# Patient Record
Sex: Female | Born: 2000 | Race: Black or African American | Hispanic: No | Marital: Single | Smoking: Never smoker
Health system: Southern US, Community
[De-identification: ages and names within clinical notes are randomized; demographics above are authoritative.]

## PROBLEM LIST (undated history)

## (undated) ENCOUNTER — Inpatient Hospital Stay (HOSPITAL_COMMUNITY): Payer: Self-pay

## (undated) DIAGNOSIS — O24419 Gestational diabetes mellitus in pregnancy, unspecified control: Secondary | ICD-10-CM

## (undated) DIAGNOSIS — F419 Anxiety disorder, unspecified: Secondary | ICD-10-CM

## (undated) DIAGNOSIS — F32A Depression, unspecified: Secondary | ICD-10-CM

## (undated) DIAGNOSIS — O139 Gestational [pregnancy-induced] hypertension without significant proteinuria, unspecified trimester: Secondary | ICD-10-CM

## (undated) DIAGNOSIS — F4310 Post-traumatic stress disorder, unspecified: Secondary | ICD-10-CM

## (undated) DIAGNOSIS — G40909 Epilepsy, unspecified, not intractable, without status epilepticus: Secondary | ICD-10-CM

## (undated) HISTORY — DX: Gestational (pregnancy-induced) hypertension without significant proteinuria, unspecified trimester: O13.9

## (undated) HISTORY — DX: Gestational diabetes mellitus in pregnancy, unspecified control: O24.419

---
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
## 2022-06-23 ENCOUNTER — Ambulatory Visit (INDEPENDENT_AMBULATORY_CARE_PROVIDER_SITE_OTHER): Payer: Medicaid Other | Admitting: Primary Care

## 2022-06-23 ENCOUNTER — Encounter (INDEPENDENT_AMBULATORY_CARE_PROVIDER_SITE_OTHER): Payer: Self-pay | Admitting: Primary Care

## 2022-06-23 VITALS — BP 122/92 | HR 80 | Resp 16 | Ht 63.0 in | Wt 337.2 lb

## 2022-06-23 DIAGNOSIS — L309 Dermatitis, unspecified: Secondary | ICD-10-CM

## 2022-06-23 DIAGNOSIS — F418 Other specified anxiety disorders: Secondary | ICD-10-CM

## 2022-06-23 DIAGNOSIS — F419 Anxiety disorder, unspecified: Secondary | ICD-10-CM

## 2022-06-23 DIAGNOSIS — Z7689 Persons encountering health services in other specified circumstances: Secondary | ICD-10-CM

## 2022-06-23 DIAGNOSIS — H539 Unspecified visual disturbance: Secondary | ICD-10-CM | POA: Diagnosis not present

## 2022-06-23 DIAGNOSIS — J3089 Other allergic rhinitis: Secondary | ICD-10-CM | POA: Diagnosis not present

## 2022-06-23 DIAGNOSIS — N921 Excessive and frequent menstruation with irregular cycle: Secondary | ICD-10-CM

## 2022-06-23 DIAGNOSIS — Z6841 Body Mass Index (BMI) 40.0 and over, adult: Secondary | ICD-10-CM

## 2022-06-23 DIAGNOSIS — F32A Depression, unspecified: Secondary | ICD-10-CM

## 2022-06-23 MED ORDER — CETIRIZINE HCL 10 MG PO TABS: 10.0000 mg | ORAL_TABLET | Freq: Every day | ORAL | 2 refills | Status: DC

## 2022-06-23 MED ORDER — HYDROXYZINE PAMOATE 50 MG PO CAPS: 50.0000 mg | ORAL_CAPSULE | Freq: Three times a day (TID) | ORAL | 1 refills | Status: DC | PRN

## 2022-06-23 NOTE — Progress Notes (Signed)
   New Patient Office Visit  Subjective    Patient ID: Angelica Holmes, female    DOB: [birth year]  Age: 22 y.o. MRN: [id number]  CC:  Chief Complaint  Patient presents with   New Patient (Initial Visit)    HPI Angelica Holmes presents to establish care.    Outpatient Encounter Medications as of 06/23/2022  Medication Sig   albuterol (VENTOLIN HFA) 108 (90 Base) MCG/ACT inhaler Inhale 2 puffs into the lungs every 6 (six) hours as needed for wheezing or shortness of breath.   HYDROXYZINE PAMOATE PO Take 1 tablet by mouth daily.   No facility-administered encounter medications on file as of 06/23/2022.    Past Medical History:  Diagnosis Date   Anxiety    Depression    PTSD (post-traumatic stress disorder)     No past surgical history on file.  No family history on file.  Social History   Socioeconomic History   Marital status: Single    Spouse name: Not on file   Number of children: Not on file   Years of education: Not on file   Highest education level: Not on file  Occupational History   Not on file  Tobacco Use   Smoking status: Never   Smokeless tobacco: Never  Substance and Sexual Activity   Alcohol use: Never   Drug use: Never   Sexual activity: Never  Other Topics Concern   Not on file  Social History Narrative   Not on file   Social Determinants of Health   Financial Resource Strain: Not on file  Food Insecurity: Not on file  Transportation Needs: Not on file  Physical Activity: Not on file  Stress: Not on file  Social Connections: Not on file  Intimate Partner Violence: Not on file    ROS Positive for anxiety and depression no suicidal thoughts or ideations no auditory or visual hallucinations     Objective    Blood Pressure (Abnormal) 122/92   Pulse 80   Respiration 16   Height 5\' 3"  (1.6 m)   Weight (Abnormal) 337 lb 3.2 oz (153 kg)   Last Menstrual Period 06/09/2022   Oxygen Saturation 96%   Body Mass Index 59.73 kg/m    Physical Exam Not done- patient had emergency and left will finish visit on the phone    Assessment & Plan:  Angelica Holmes was seen today for new patient (initial visit).  Diagnoses and all orders for this visit:  Encounter to establish care  Depression, unspecified depression type -     Ambulatory referral to Psychiatry  Anxiety -     Ambulatory referral to Psychiatry -     hydrOXYzine (VISTARIL) 50 MG capsule; Take 1 capsule (50 mg total) by mouth every 8 (eight) hours as needed.  Eczema, unspecified type -     hydrOXYzine (VISTARIL) 50 MG capsule; Take 1 capsule (50 mg total) by mouth every 8 (eight) hours as needed.  Morbid (severe) obesity due to excess calories (HCC)  Non-seasonal allergic rhinitis, unspecified trigger -     cetirizine (ZYRTEC) 10 MG tablet; Take 1 tablet (10 mg total) by mouth daily.  Vision changes -     Ambulatory referral to Ophthalmology  Menorrhagia with irregular cycle -     Ambulatory referral to Obstetrics / Gynecology       Kerin Perna, NP

## 2022-07-10 ENCOUNTER — Emergency Department (HOSPITAL_COMMUNITY)
Admission: EM | Admit: 2022-07-10 | Discharge: 2022-07-11 | Disposition: A | Discharge status: Home / Self Care | Payer: Medicaid Other | Attending: Emergency Medicine | Admitting: Emergency Medicine

## 2022-07-10 ENCOUNTER — Emergency Department (HOSPITAL_COMMUNITY): Payer: Medicaid Other

## 2022-07-10 ENCOUNTER — Other Ambulatory Visit: Payer: Self-pay

## 2022-07-10 DIAGNOSIS — F445 Conversion disorder with seizures or convulsions: Secondary | ICD-10-CM

## 2022-07-10 DIAGNOSIS — R569 Unspecified convulsions: Secondary | ICD-10-CM | POA: Insufficient documentation

## 2022-07-10 LAB — CBC WITH DIFFERENTIAL/PLATELET
Abs Immature Granulocytes: 0.01 K/uL (ref 0.00–0.07)
Basophils Absolute: 0 K/uL (ref 0.0–0.1)
Basophils Relative: 1 %
Eosinophils Absolute: 0.1 K/uL (ref 0.0–0.5)
Eosinophils Relative: 1 %
HCT: 37.1 % (ref 36.0–46.0)
Hemoglobin: 11.9 g/dL — ABNORMAL LOW (ref 12.0–15.0)
Immature Granulocytes: 0 %
Lymphocytes Relative: 47 %
Lymphs Abs: 2.9 K/uL (ref 0.7–4.0)
MCH: 28.7 pg (ref 26.0–34.0)
MCHC: 32.1 g/dL (ref 30.0–36.0)
MCV: 89.4 fL (ref 80.0–100.0)
Monocytes Absolute: 0.4 K/uL (ref 0.1–1.0)
Monocytes Relative: 6 %
Neutro Abs: 2.8 K/uL (ref 1.7–7.7)
Neutrophils Relative %: 45 %
Platelets: 295 K/uL (ref 150–400)
RBC: 4.15 MIL/uL (ref 3.87–5.11)
RDW: 13.9 % (ref 11.5–15.5)
WBC: 6.2 K/uL (ref 4.0–10.5)
nRBC: 0 % (ref 0.0–0.2)

## 2022-07-10 LAB — URINALYSIS, ROUTINE W REFLEX MICROSCOPIC
Bilirubin Urine: NEGATIVE
Glucose, UA: NEGATIVE mg/dL
Hgb urine dipstick: NEGATIVE
Ketones, ur: NEGATIVE mg/dL
Leukocytes,Ua: NEGATIVE
Nitrite: NEGATIVE
Protein, ur: NEGATIVE mg/dL
Specific Gravity, Urine: 1.02 (ref 1.005–1.030)
pH: 8 (ref 5.0–8.0)

## 2022-07-10 LAB — COMPREHENSIVE METABOLIC PANEL WITH GFR
ALT: 14 U/L (ref 0–44)
AST: 18 U/L (ref 15–41)
Albumin: 3.2 g/dL — ABNORMAL LOW (ref 3.5–5.0)
Alkaline Phosphatase: 82 U/L (ref 38–126)
Anion gap: 9 (ref 5–15)
BUN: 6 mg/dL (ref 6–20)
CO2: 25 mmol/L (ref 22–32)
Calcium: 8.6 mg/dL — ABNORMAL LOW (ref 8.9–10.3)
Chloride: 104 mmol/L (ref 98–111)
Creatinine, Ser: 0.86 mg/dL (ref 0.44–1.00)
GFR, Estimated: >60 mL/min
Glucose, Bld: 79 mg/dL (ref 70–99)
Potassium: 3.7 mmol/L (ref 3.5–5.1)
Sodium: 138 mmol/L (ref 135–145)
Total Bilirubin: 0.3 mg/dL (ref 0.3–1.2)
Total Protein: 6.9 g/dL (ref 6.5–8.1)

## 2022-07-10 LAB — RAPID URINE DRUG SCREEN, HOSP PERFORMED
Amphetamines: NOT DETECTED
Barbiturates: NOT DETECTED
Benzodiazepines: NOT DETECTED
Cocaine: NOT DETECTED
Opiates: NOT DETECTED
Tetrahydrocannabinol: NOT DETECTED

## 2022-07-10 LAB — I-STAT BETA HCG BLOOD, ED (MC, WL, AP ONLY): I-stat hCG, quantitative: <5 m[IU]/mL (ref <5)

## 2022-07-10 MED ORDER — LORAZEPAM 2 MG/ML IJ SOLN
INTRAMUSCULAR | Status: AC
  Filled 2022-07-10: qty 1

## 2022-07-10 NOTE — Progress Notes (Signed)
Spoke with RN. MRI reached out to her first. Tech will place Overnight EEG when patient is available.

## 2022-07-10 NOTE — ED Notes (Signed)
RN educated patient on need for follow up with referral at time of discharge and the importance of follow up of seizure diagnosis and

## 2022-07-10 NOTE — ED Triage Notes (Signed)
Patient reports multiple seizures since last night at work , denies injury , respirations unlabored . Alert and oriented .

## 2022-07-10 NOTE — ED Provider Notes (Signed)
[facility] Provider Note   CSN: [id number] Arrival date & time: 07/10/22  1916     History  Chief Complaint  Patient presents with   Seizures    Angelica Holmes is a 22 y.o. female.   Seizures  Patient is a 22 year old female with past medical history significant for depression, PTSD, anxiety, self-injurious behavior on no medications  Patient is a 22 year old female who presents emergency room today with complaints of seizures over the past 1 year.  She states that she has rarely gone more than 1 week without a seizure.  She states she does have bladder incontinence with her episodes but no tongue biting.  She denies any fevers lightheadedness or dizziness no back pain or headache.  She has a history of self injury with attempted overdose with Wellbutrin over a year ago and lacerations self-inflicted to left upper extremity however no self-harm today.      Home Medications Prior to Admission medications   Medication Sig Start Date End Date Taking? Authorizing Provider  albuterol (VENTOLIN HFA) 108 (90 Base) MCG/ACT inhaler Inhale 2 puffs into the lungs every 6 (six) hours as needed for wheezing or shortness of breath.    [provider]  cetirizine (ZYRTEC) 10 MG tablet Take 1 tablet (10 mg total) by mouth daily. 06/23/22   Kerin Perna, NP  hydrOXYzine (VISTARIL) 50 MG capsule Take 1 capsule (50 mg total) by mouth every 8 (eight) hours as needed. 06/23/22   Kerin Perna, NP      Allergies    Patient has no known allergies.    Review of Systems   Review of Systems  Neurological:  Positive for seizures.    Physical Exam Updated Vital Signs BP 134/62   Pulse 79   Temp (!) 97.3 F (36.3 C)   Resp 19   LMP 06/09/2022   SpO2 100%  Physical Exam Vitals and nursing note reviewed.  Constitutional:      General: She is not in acute distress. HENT:     Head: Normocephalic and atraumatic.     Nose:  Nose normal.     Mouth/Throat:     Mouth: Mucous membranes are moist.  Eyes:     General: No scleral icterus. Cardiovascular:     Rate and Rhythm: Normal rate and regular rhythm.     Pulses: Normal pulses.     Heart sounds: Normal heart sounds.  Pulmonary:     Effort: Pulmonary effort is normal. No respiratory distress.     Breath sounds: No wheezing.  Abdominal:     Palpations: Abdomen is soft.     Tenderness: There is no abdominal tenderness.  Musculoskeletal:     Cervical back: Normal range of motion.     Right lower leg: No edema.     Left lower leg: No edema.  Skin:    General: Skin is warm and dry.     Capillary Refill: Capillary refill takes less than 2 seconds.  Neurological:     Mental Status: She is alert.     Comments: Stuttering speech, AOx3  Right upper extremity with weakness in grip flexion extension of the elbow and unable to lift right leg off of bed.  Subjectively decreased sensation in right upper extremity right lower extremity and right face.    Psychiatric:        Mood and Affect: Mood normal.        Behavior: Behavior normal.  ED Results / Procedures / Treatments   Labs (all labs ordered are listed, but only abnormal results are displayed) Labs Reviewed  CBC WITH DIFFERENTIAL/PLATELET - Abnormal; Notable for the following components:      Result Value   Hemoglobin 11.9 (*)    All other components within normal limits  COMPREHENSIVE METABOLIC PANEL - Abnormal; Notable for the following components:   Calcium 8.6 (*)    Albumin 3.2 (*)    All other components within normal limits  RAPID URINE DRUG SCREEN, HOSP PERFORMED  URINALYSIS, ROUTINE W REFLEX MICROSCOPIC  I-STAT BETA HCG BLOOD, ED ([facility], [facility], [facility] ONLY)    EKG None  Radiology No results found.  Procedures Procedures    Medications Ordered in ED Medications - No data to display  ED Course/ Medical Decision Making/ A&P Clinical Course as of 07/10/22 2315  Sat Jul 10, 2022   2130 States she had a seizure this AM -- woke up on the floor of bathroom.  On the drive in.  And another seizure in ER.    First seen 1 year ago.  [WF]  2232 Discussed with Sal of neurology who will evaluate -- MRI ordered. [WF]    Clinical Course User Index [WF] Tedd Sias, [place]                             Medical Decision Making Amount and/or Complexity of Data Reviewed Labs: ordered. Radiology: ordered.   This patient presents to the ED for concern of seizure-like activity, this involves a number of treatment options, and is a complaint that carries with it a moderate risk of complications and morbidity. A differential diagnosis was considered for the patient's symptoms which is discussed below:   The differential includes but is not limited to psychogenic nonepileptic seizures, epilepsy, induced seizure from recreational drugs/herbs, illness, other   Co morbidities: Discussed in HPI   Brief History:  Patient is a 22 year old female with past medical history significant for depression, PTSD, anxiety, self-injurious behavior on no medications  Patient is a 22 year old female who presents emergency room today with complaints of seizures over the past 1 year.  She states that she has rarely gone more than 1 week without a seizure.  She states she does have bladder incontinence with her episodes but no tongue biting.  She denies any fevers lightheadedness or dizziness no back pain or headache.  She has a history of self injury with attempted overdose with Wellbutrin over a year ago and lacerations self-inflicted to left upper extremity however no self-harm today.    EMR reviewed including pt PMHx, past surgical history and past visits to ER.   See HPI for more details   Lab Tests:   I personally reviewed all laboratory work and imaging. Metabolic panel without any acute abnormality specifically kidney function within normal limits and no significant electrolyte  abnormalities. CBC without leukocytosis or significant anemia. CBC and CMP without any acute abnormal findings.  I sedated she negative pregnancy.  Urinalysis and urine drug screen pending at time of shift change  Imaging Studies:  MRI brain pending at time of shift change    Cardiac Monitoring:  The patient was maintained on a cardiac monitor.  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  Neurology to do EEG and MRI.  Shift change handoff plan is to discharge after normal workup.  This plan can change.  Dispostion:    Final Clinical Impression(s) / ED Diagnoses Final diagnoses:  None    Rx / DC Orders ED Discharge Orders     None         Tedd Sias, [place] 07/10/22 2317    Drenda Freeze, MD 07/10/22 [phone]

## 2022-07-10 NOTE — Progress Notes (Signed)
Patient will not be ready for the next 2 hours. EEG to be placed at the appropriate time.

## 2022-07-10 NOTE — ED Provider Notes (Signed)
  Physical Exam  BP 134/62   Pulse 79   Temp (!) 97.3 F (36.3 C)   Resp 19   LMP 06/09/2022   SpO2 100%   Physical Exam  Procedures  Procedures  ED Course / MDM   Clinical Course as of 07/10/22 2312  Sat Jul 10, 2022  2130 States she had a seizure this AM -- woke up on the floor of bathroom.  On the drive in.  And another seizure in ER.    First seen 1 year ago.  [WF]  2232 Discussed with Sal of neurology who will evaluate -- MRI ordered. [WF]    Clinical Course User Index [WF] Tedd Sias, [place]   Medical Decision Making Amount and/or Complexity of Data Reviewed Labs: ordered. Radiology: ordered.   ***  Care of this patient assumed from preceding ED provider Pati Gallo, PA-C at time of shift change. Please see her associated note for further insight into the patient's ED course. In brief, patient with > 1 year of recurrent seizure like activity. On exam today has stuttering speech, RUE /RLE weakness per preceding ED provider. At time of shift change, pending EEG and MRI brain at recommendation of neurologist Dr. Cheyenne Adas. Plan for studies tonight and likely discharge home in the AM.

## 2022-07-10 NOTE — ED Notes (Signed)
RN made aware that patient was having seizure in room. RN witnessed patient having seizure with convulsions to neck and arms. Seizure last approx 1 minute. Pt somewhat dysarthric, no confusion or decreased LOC. IV started MD made aware.

## 2022-07-10 NOTE — Consult Note (Signed)
NEUROLOGY CONSULTATION NOTE   Date of service: July 10, 2022 Patient Name: Angelica Holmes MRN:  [id number] DOB:  [birth year] Reason for consult: "Seizure vs PNES" Requesting Provider: Drenda Freeze, MD _ _ _   _ __   _ __ _ _  __ __   _ __   __ _  History of Present Illness  Angelica Holmes is a 22 y.o. female with PMH significant for anxiety, PTSD, prior intentional wellbutrin overdose as a suicide attempt, depression, who presents from home with episode concerning for seizure vs PNES.  Reports RUE jerking. The first episode she has was in feb 2021. She had another seizure like episode back in sept of 2023. 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 She was discharged from the ED. She returns today with rhythmic RUE jerking, stuttering speech and eyelid fluttering when she closes her eyes. Reports that this has been going on for 2 days now. Her boyfriend and roommate have been with her and they have been watching over her. They noted that when she would be sleeping, the movments would go away before returning when she wakes up.  No hx of seizures asa kid, no family hx of seizures or epilepsy. No hx of significant head injury with LOC, except for yesterday when she was at work and fell from a seizure in the bathroom and thinks that she hit her head. No loss of bowel or bladder. Feels like her tongue is sore with no obvious tongue bite marks noted. She is not on any medications at home.    ROS   Constitutional Denies weight loss, fever and chills.   HEENT Denies changes in vision and hearing.   Respiratory Denies SOB and cough.   CV Denies palpitations and CP   GI Denies abdominal pain, nausea, vomiting and diarrhea.   GU Denies dysuria and urinary frequency.   MSK Denies myalgia and joint pain.   Skin Denies rash and pruritus.   Neurological Denies headache and syncope.   Psychiatric Denies recent changes in mood. Denies anxiety and depression.    Past History   Past Medical  History:  Diagnosis Date   Anxiety    Depression    PTSD (post-traumatic stress disorder)    No past surgical history on file. No family history on file. Social History   Socioeconomic History   Marital status: Single    Spouse name: Not on file   Number of children: Not on file   Years of education: Not on file   Highest education level: Not on file  Occupational History   Not on file  Tobacco Use   Smoking status: Never   Smokeless tobacco: Never  Substance and Sexual Activity   Alcohol use: Never   Drug use: Never   Sexual activity: Never  Other Topics Concern   Not on file  Social History Narrative   Not on file   Social Determinants of Health   Financial Resource Strain: Not on file  Food Insecurity: Not on file  Transportation Needs: Not on file  Physical Activity: Not on file  Stress: Not on file  Social Connections: Not on file   No Known Allergies  Medications  (Not in a hospital admission)    Vitals   Vitals:   07/10/22 1923 07/10/22 2215  BP: (!) 163/115 134/62  Pulse: 93 79  Resp: 20 19  Temp: (!) 97.3 F (36.3 C)   SpO2: 100% 100%  There is no height or weight on file to calculate BMI.  Physical Exam   General: Laying comfortably in bed; in no acute distress.  HENT: Normal oropharynx and mucosa. Normal external appearance of ears and nose.  Neck: Supple, no pain or tenderness  CV: No JVD. No peripheral edema.  Pulmonary: Symmetric Chest rise. Normal respiratory effort.  Abdomen: Soft to touch, non-tender.  Ext: No cyanosis, edema, or deformity  Skin: No rash. Normal palpation of skin.   Musculoskeletal: Normal digits and nails by inspection. No clubbing.   Neurologic Examination  Mental status/Cognition: Alert, oriented to self, place, month and year, good attention.  Speech/language: Fluent, comprehension intact, object naming intact, repetition intact.  Cranial nerves:   CN II Pupils equal and reactive to light, no VF  deficits    CN III,IV,VI EOM intact, no gaze preference or deviation, no nystagmus    CN V normal sensation in V1, V2, and V3 segments bilaterally    CN VII no asymmetry, no nasolabial fold flattening    CN VIII normal hearing to speech    CN IX & X normal palatal elevation, no uvular deviation    CN XI 5/5 head turn and 5/5 shoulder shrug bilaterally    CN XII midline tongue protrusion    Motor:  Muscle bulk: normal, tone normal, pronator drift none, rhythmic twitching of R hand. Decreases in amplitude when distracted but does not completely go away. Mvmt Root Nerve  Muscle Right Left Comments  SA C5/6 Ax Deltoid 5 5   EF C5/6 Mc Biceps 5 5   EE C6/7/8 Rad Triceps 5 5   WF C6/7 Med FCR     WE C7/8 PIN ECU     F Ab C8/T1 U ADM/FDI 5 5   HF L1/2/3 Fem Illopsoas 5 5   KE L2/3/4 Fem Quad 5 5   DF L4/5 D Peron Tib Ant 5 5   PF S1/2 Tibial Grc/Sol 4+ 5    Sensation:  Light touch Mildly decreased in RUE and RLE to touch.   Pin prick    Temperature    Vibration   Proprioception    Coordination/Complex Motor:  - Finger to Nose intact BL - Heel to shin unable to do 2/2 body habitus. - Rapid alternating movement are normal - Gait: deferred.  Labs   CBC:  Recent Labs  Lab 07/10/22 1927  WBC 6.2  NEUTROABS 2.8  HGB 11.9*  HCT 37.1  MCV 89.4  PLT [id number]    Basic Metabolic Panel:  Lab Results  Component Value Date   NA 138 07/10/2022   K 3.7 07/10/2022   CO2 25 07/10/2022   GLUCOSE 79 07/10/2022   BUN 6 07/10/2022   CREATININE 0.86 07/10/2022   CALCIUM 8.6 (L) 07/10/2022   GFRNONAA >60 07/10/2022   Lipid Panel: No results found for: "LDLCALC" HgbA1c: No results found for: "HGBA1C" Urine Drug Screen:     Component Value Date/Time   LABOPIA NONE DETECTED 11/08/2021 0234   COCAINSCRNUR NONE DETECTED 11/08/2021 0234   LABBENZ NONE DETECTED 11/08/2021 0234   AMPHETMU NONE DETECTED 11/08/2021 0234   THCU NONE DETECTED 11/08/2021 0234   LABBARB NONE DETECTED 11/08/2021  0234    Alcohol Level     Component Value Date/Time   ETH <10 11/08/2021 0237    CT Head without contrast from 11/08/21(Personally reviewed): CTH was negative for a large hypodensity concerning for a large territory infarct or hyperdensity concerning for an ICH  MRI Brain(Personally  reviewed): pending  cEEG:  pending  Impression   Angelica Holmes is a 22 y.o. female with PMH significant for anxiety, PTSD, prior intentional wellbutrin overdose as a suicide attempt, depression, who presents from home with episode concerning for seizure vs PNES. This has been ongoing for 2 days. The movement seems rhythmic at times and ?distractable as they improve when she is distracted but do not completely go away. She has stuttering speech intermittently along with eyelid fluttering at times.  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 However, given the fact that this has been occurring persistently, will put her up on LTM EEG to characterize these.  Recommendations  - getting MRI Brain - Will put her up on LTM EEG. - I don't particularly think that she needs to be admitted, good chance we would be able to capture on LTM overnight since these movements are persistent. ______________________________________________________________________   Thank you for the opportunity to take part in the care of this patient. If you have any further questions, please contact the neurology consultation attending.  Signed,  [place] Pager Number [phone] _ _ _   _ __   _ __ _ _  __ __   _ __   __ _

## 2022-07-11 ENCOUNTER — Emergency Department (HOSPITAL_COMMUNITY): Payer: Medicaid Other

## 2022-07-11 DIAGNOSIS — R569 Unspecified convulsions: Secondary | ICD-10-CM

## 2022-07-11 DIAGNOSIS — F449 Dissociative and conversion disorder, unspecified: Secondary | ICD-10-CM

## 2022-07-11 NOTE — ED Notes (Signed)
EEG tech notified that patient is back from MRI so that over night EEG can be applied.

## 2022-07-11 NOTE — Procedures (Signed)
Patient Name: Angelica Holmes  MRN: [id number]  Epilepsy Attending: Lora Havens  Referring Physician/Provider: Donnetta Simpers, MD   Duration: 07/11/2022 0200 to 1042  Patient history: 22 y.o. female with PMH significant for anxiety, PTSD, prior intentional wellbutrin overdose as a suicide attempt, depression, who presents from home with episode concerning for seizure vs PNES.  EEG to evaluate for seizure.   Level of alertness: Awake, asleep  AEDs during EEG study: None  Technical aspects: This EEG study was done with scalp electrodes positioned according to the 10-20 International system of electrode placement. Electrical activity was reviewed with band pass filter of 1-70Hz$ , sensitivity of 7 uV/mm, display speed of 59m/sec with a 60Hz$  notched filter applied as appropriate. EEG data were recorded continuously and digitally stored.  Video monitoring was available and reviewed as appropriate.  Description: The posterior dominant rhythm consists of 9 Hz activity of moderate voltage (25-35 uV) seen predominantly in posterior head regions, symmetric and reactive to eye opening and eye closing. Sleep was characterized by vertex waves, sleep spindles (12 to 14 Hz), maximal frontocentral region.  Hyperventilation and photic stimulation were not performed.     Event button was pressed on 07/11/2022 at 1029 for right arm tremor like movement ( video ws unavailable for review)Concomitant eeg before, during and after the event showed normal posterior dominant rhythm.   IMPRESSION: This study is within normal limits. No seizures or epileptiform discharges were seen throughout the recording.  One event was noted on 07/11/2022 at 1029. Patient was noted to have right arm tremor like movement without concomitant eeg change. This was NOT an epileptic event.   A normal interictal EEG does not exclude the diagnosis of epilepsy.  [place] OBarbra Sarks

## 2022-07-11 NOTE — Progress Notes (Signed)
LTM EEG discontinued - no skin breakdown at [facility]. GMD/NA

## 2022-07-11 NOTE — ED Provider Notes (Signed)
Care handoff received from [place] PA-C at shift change please see previous provider note for full details of visit.  In short 22 year old female presented for recurrent seizure-like activity.  Patient was seen by neurology who had recommended MRI and EEG today.  MRI was without acute findings.  Plan was for overnight EEG, after completion consult neuro anticipate discharge. Physical Exam  BP (!) 128/90   Pulse 81   Temp 98 F (36.7 C) (Oral)   Resp 20   LMP 06/09/2022   SpO2 100%   Physical Exam Constitutional:      General: She is not in acute distress.    Appearance: Normal appearance. She is well-developed. She is not ill-appearing or diaphoretic.  HENT:     Head: Normocephalic and atraumatic.  Eyes:     General: Vision grossly intact. Gaze aligned appropriately.     Pupils: Pupils are equal, round, and reactive to light.  Neck:     Trachea: Trachea and phonation normal.  Pulmonary:     Effort: Pulmonary effort is normal. No respiratory distress.  Musculoskeletal:        General: Normal range of motion.     Cervical back: Normal range of motion.  Skin:    General: Skin is warm and dry.  Neurological:     Mental Status: She is alert.     GCS: GCS eye subscore is 4. GCS verbal subscore is 5. GCS motor subscore is 6.     Comments: Speech is clear and goal oriented, follows commands Major Cranial nerves without deficit, no facial droop Moves extremities without ataxia, coordination intact  Psychiatric:        Behavior: Behavior normal.     Procedures  Procedures  ED Course / MDM   Clinical Course as of 07/11/22 1051  Sat Jul 10, 2022  2130 States she had a seizure this AM -- woke up on the floor of bathroom.  On the drive in.  And another seizure in ER.    First seen 1 year ago.  [WF]  2232 Discussed with Sal of neurology who will evaluate -- MRI ordered. [WF]  Sun Jul 11, 2022  0608 Patient remains on overnight EEG with video monitoring at this time.  [RS]  [id number] kirkpatrick [BM]    Clinical Course User Index [BM] Deliah Boston, PA-C [RS] Sponseller, Gypsy Balsam, PA-C [WF] Tedd Sias, [place]   Medical Decision Making Amount and/or Complexity of Data Reviewed Labs: ordered.    Details: CBC without leukocytosis to suggest infectious process.  Mild anemia of 11.9.  No thrombocytopenia. CMP without emergent electrolyte derangement, AKI, LFT elevations or gap. Pregnancy test negative. UDS negative. Urinalysis no evidence for infection. Radiology: ordered. ECG/medicine tests:     Details: I personally reviewed and interpreted patient's twelve-lead EKG.  I do not appreciate any obvious acute ischemic changes. Discussion of management or test interpretation with external provider(s): Dr. Leonel Ramsay.  Risk Risk Details: EEG interpreted by neurology without epileptiform discharges.  I consulted with Dr. Leonel Ramsay who asked that we keep the patient here for him to see and then anticipates discharge.   Patient seen and evaluated by neurologist Dr. 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  Advises that patient can be discharged and recommends outpatient follow-up with a psychologist.  I will refer patient to [facility].  Patient is in agreement with plan.  All questions answered.  At time of discharge patient is well-appearing and in no acute distress vital signs stable  on room air.  Precautions regarding motor vehicle and other dangerous activities given.  At this time there does not appear to be any evidence of an acute emergency medical condition and the patient appears stable for discharge with appropriate outpatient follow up. Diagnosis was discussed with patient who verbalizes understanding of care plan and is agreeable to discharge. I have discussed return precautions with patient who verbalizes understanding. Patient encouraged to follow-up with their PCP and behavioral health. All questions answered.  Patient's case  discussed with Dr. Sabra Heck who agrees with plan to discharge with follow-up.   Note: Portions of this report may have been transcribed using voice recognition software. Every effort was made to ensure accuracy; however, inadvertent computerized transcription errors may still be present.        Gari Crown 07/11/22 1052    Noemi Chapel, MD 07/19/22 [phone]

## 2022-07-11 NOTE — Progress Notes (Signed)
LTM EEG hooked up and running - no initial skin breakdown - push button tested - No [facility] at this time; patient is in the ED currently.

## 2022-07-11 NOTE — ED Notes (Signed)
Patient returned from MRI.

## 2022-07-11 NOTE — ED Notes (Signed)
Eeg tech at bedside. Friend waiting with patient was given recliner for comfort while waiting.

## 2022-07-11 NOTE — Discharge Instructions (Signed)
At this time there does not appear to be the presence of an emergent medical condition, however there is always the potential for conditions to change. Please read and follow the below instructions.  Please return to the Emergency Department immediately for any new or worsening symptoms. Please be sure to follow up with your Primary Care Provider within one week regarding your visit today; please call their office to schedule an appointment even if you are feeling better for a follow-up visit. Please do not drive, climb, swim or perform any other dangerous activities until cleared to do so by your doctor.  Please read the additional information packets attached to your discharge summary.  Go to the nearest Emergency Department immediately if: You have fever or chills You injure yourself during a seizure. You have one seizure after another. You have trouble recovering from a seizure. You have chest pain or trouble breathing. A seizure lasts longer than 5 minutes. You think about harming yourself or others. You have any new/concerning or worsening of symptoms.  Do not take your medicine if  develop an itchy rash, swelling in your mouth or lips, or difficulty breathing; call 911 and seek immediate emergency medical attention if this occurs.  You may review your lab tests and imaging results in their entirety on your MyChart account.  Please discuss all results of fully with your primary care provider and other specialist at your follow-up visit.  Note: Portions of this text may have been transcribed using voice recognition software. Every effort was made to ensure accuracy; however, inadvertent computerized transcription errors may still be present.

## 2022-07-11 NOTE — Consult Note (Signed)
Subjective: Overnight EEG is normal.   Exam: Vitals:   07/11/22 0915 07/11/22 0930  BP: (!) 143/82 (!) 133/96  Pulse: 89 68  Resp: 19 17  Temp:    SpO2: 98% 99%   Gen: In bed, NAD Resp: non-labored breathing, no acute distress Abd: soft, nt  Neuro: MS: awake, alert, has stutter but no aphasia [id number], VFF Motor: some give way on right, no definite weakness Sensory: decreased on right, splits midline to pinprick on forehead She has a coarse right arm tremor that is clearly distractible on exam.   Pertinent Labs: MRI negative EEG negative  Impression: 22 yo F with non-organic right arm shaking and stuttering speech. 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 Her right arm tremor and distractible tremor are most consistent with conversion disorder. I have advised that she follow up with an outpatient psychologist to explore possible underlying stressors that could be contributing to this.   Recommendations: 1) Can d/c EEG 2) No further neurological evaluation is needed at this time. Please call with further questions or concerns.   Roland Rack, MD [facility] [phone]  If 7pm- 7am, please page neurology on call as listed in [place].

## 2022-09-28 ENCOUNTER — Ambulatory Visit (INDEPENDENT_AMBULATORY_CARE_PROVIDER_SITE_OTHER): Payer: Medicaid Other | Admitting: Primary Care

## 2022-09-28 ENCOUNTER — Ambulatory Visit (HOSPITAL_COMMUNITY)
Admission: EM | Admit: 2022-09-28 | Discharge: 2022-09-28 | Disposition: A | Discharge status: Home / Self Care | Payer: No Typology Code available for payment source | Attending: Psychiatry | Admitting: Psychiatry

## 2022-09-28 VITALS — BP 128/83 | HR 87 | Resp 16 | Wt 341.8 lb

## 2022-09-28 DIAGNOSIS — F419 Anxiety disorder, unspecified: Secondary | ICD-10-CM

## 2022-09-28 DIAGNOSIS — F32A Depression, unspecified: Secondary | ICD-10-CM

## 2022-09-28 DIAGNOSIS — Z9151 Personal history of suicidal behavior: Secondary | ICD-10-CM | POA: Insufficient documentation

## 2022-09-28 DIAGNOSIS — R569 Unspecified convulsions: Secondary | ICD-10-CM

## 2022-09-28 NOTE — Progress Notes (Signed)
Subjective    Patient ID: Angelica Holmes, female    DOB: June 15, 2000  Age: 22 y.o. MRN: [id number]  CC:  No chief complaint on file.   HPI Ms.Angelica Holmes is a 22 year old female for follow up presents problems with anxiety, depression, self esteem. Hired and had a seizure at work non empathetic feels it is emotional per neurology. Patient has No headache, No chest pain, No abdominal pain - No Nausea, No new weakness tingling or numbness, No Cough - shortness of breath.  Outpatient Encounter Medications as of 09/28/2022  Medication Sig  . albuterol (VENTOLIN HFA) 108 (90 Base) MCG/ACT inhaler Inhale 2 puffs into the lungs every 6 (six) hours as needed for wheezing or shortness of breath.  . cetirizine (ZYRTEC) 10 MG tablet Take 1 tablet (10 mg total) by mouth daily.  . hydrOXYzine (VISTARIL) 50 MG capsule Take 1 capsule (50 mg total) by mouth every 8 (eight) hours as needed.   No facility-administered encounter medications on file as of 09/28/2022.    Past Medical History:  Diagnosis Date  . Anxiety   . Depression   . PTSD (post-traumatic stress disorder)     No past surgical history on file.  No family history on file.  Social History   Socioeconomic History  . Marital status: Single    Spouse name: Not on file  . Number of children: Not on file  . Years of education: Not on file  . Highest education level: GED or equivalent  Occupational History  . Not on file  Tobacco Use  . Smoking status: Never  . Smokeless tobacco: Never  Substance and Sexual Activity  . Alcohol use: Never  . Drug use: Never  . Sexual activity: Never  Other Topics Concern  . Not on file  Social History Narrative  . Not on file   Social Determinants of Health   Financial Resource Strain: High Risk (09/28/2022)   Overall Financial Resource Strain (CARDIA)   . Difficulty of Paying Living Expenses: Hard  Food Insecurity: Food Insecurity Present (09/28/2022)   Hunger Vital Sign   . Worried  About Programme researcher, broadcasting/film/video in the Last Year: Sometimes true   . Ran Out of Food in the Last Year: Often true  Transportation Needs: No Transportation Needs (09/28/2022)   PRAPARE - Transportation   . Lack of Transportation (Medical): No   . Lack of Transportation (Non-Medical): No  Physical Activity: Insufficiently Active (09/28/2022)   Exercise Vital Sign   . Days of Exercise per Week: 1 day   . Minutes of Exercise per Session: 20 min  Stress: Stress Concern Present (09/28/2022)   [employer] of Occupational Health - Occupational Stress Questionnaire   . Feeling of Stress : Very much  Social Connections: Socially Isolated (09/28/2022)   Social Connection and Isolation Panel [NHANES]   . Frequency of Communication with Friends and Family: Twice a week   . Frequency of Social Gatherings with Friends and Family: Never   . Attends Religious Services: Never   . Active Member of Clubs or Organizations: No   . Attends Banker Meetings: Not on file   . Marital Status: Living with partner  Intimate Partner Violence: Not on file    ROS Positive for anxiety and depression no suicidal thoughts or ideations no auditory or visual hallucinations     Objective    There were no vitals taken for this visit.  Physical Exam Not done- patient  had emergency and left will finish visit on the phone    Assessment & Plan:  Angelica Holmes was seen today for new patient (initial visit).  Diagnoses and all orders for this visit:  Encounter to establish care  Depression, unspecified depression type -     Ambulatory referral to Psychiatry  Anxiety -     Ambulatory referral to Psychiatry -     hydrOXYzine (VISTARIL) 50 MG capsule; Take 1 capsule (50 mg total) by mouth every 8 (eight) hours as needed.  Eczema, unspecified type -     hydrOXYzine (VISTARIL) 50 MG capsule; Take 1 capsule (50 mg total) by mouth every 8 (eight) hours as needed.  Morbid (severe) obesity due to excess calories  (HCC)  Non-seasonal allergic rhinitis, unspecified trigger -     cetirizine (ZYRTEC) 10 MG tablet; Take 1 tablet (10 mg total) by mouth daily.  Vision changes -     Ambulatory referral to Ophthalmology  Menorrhagia with irregular cycle -     Ambulatory referral to Obstetrics / Gynecology       Grayce Sessions, NP

## 2022-09-28 NOTE — ED Provider Notes (Signed)
Behavioral Health Urgent Care Medical Screening Exam  Patient Name: Angelica Holmes MRN: [id number] Date of Evaluation: 09/28/22 Chief Complaint: "my primary care doctor" Diagnosis:  Final diagnoses:  Anxiety disorder, unspecified type  Seizure-like activity (HCC)   History of Present illness: Angelica Holmes is a 22 y.o. female. Pt presents voluntarily to [facility] behavioral health for walk-in assessment.  Pt is accompanied by her boyfriend, Benjamine Mola, who remains throughout the assessment as per pt request/consent. Pt is assessed face-to-face by nurse practitioner.   Angelica Holmes, 22 y.o., female patient seen face to face by this provider; and chart reviewed on 09/28/22.  On evaluation Angelica Holmes reports presenting today due to "my primary care doctor". She reports she was seen earlier this date and there was miscommunication after she endorsed having passing thought the other day that she "wouldn't be so stressed if not here anymore". She denies having a plan or intent to act on her thoughts at the time. Reports she spoke with her boyfriend and felt better. She states primary stressor for her has been seizure like activity that has been ongoing for the past 1 to 2 years which has made it difficult for her to maintain employment. Reports was last employed at the end of February or March. She states she has had multiple CT and EEGs and told that she has been having non epileptic seizures.  Pt denies suicidal, homicidal or violent ideations. She denies auditory visual hallucinations or paranoia.  Pt reports history of non suicidal self injurious behavior, cutting, last occurring several years earlier. Pt reports history of 2 or 3 suicide attempts between middle school and high school, last occurring several years ago. Pt reports history of inpatient psychiatric hospitalizations, last occurring several years ago after last suicide attempt. Reports last inpatient psychiatric  hospitalization was in [place], [place].   Pt denies use of nicotine, marijuana, alcohol, opioids, other substances.   Pt reports medical history of asthma, seasonal allergies.   Pt reports psychiatric history of anxiety, mdd.  Pt reports family history of bipolar 2 disorder (mother).  Pt denies access to a firearm or other weapon.  Pt reports she is living with her boyfriend, Benjamine Mola, and her friend.  Discussed recommendation for outpatient psychiatry and counseling, which pt and pt's boyfriend agree with. Pt's boyfriend denies safety concerns with discharge today. Discussed following up with outpatient psychiatry and counseling at [facility]. Pt can be seen tomorrow for both services. Pt and pt's boyfriend agree to got to open access hours at [facility] tomorrow.  Flowsheet Row ED from 09/28/2022 in [facility] [place] ED from 07/10/2022 in [facility] Emergency Department at [facility] ED from 11/08/2021 in [facility] Emergency Department at [facility]  C-SSRS RISK CATEGORY Low Risk No Risk No Risk       Psychiatric Specialty Exam  Presentation  General Appearance:Casual  Eye Contact:Fair  Speech:Clear and Coherent  Speech Volume:Normal  Handedness:Right   Mood and Affect  Mood: Dysphoric  Affect: Congruent; Appropriate   Thought Process  Thought Processes: Coherent; Goal Directed  Descriptions of Associations:Intact  Orientation:Full (Time, Place and Person)  Thought Content:Logical    Hallucinations:None  Ideas of Reference:None  Suicidal Thoughts:No  Homicidal Thoughts:No   Sensorium  Memory: Immediate Good; Recent Good; Remote Good  Judgment: Good  Insight: Good   Executive Functions  Concentration: Good  Attention Span: Good  Recall: Good  Fund of Knowledge: Good  Language: Good   Psychomotor Activity  Psychomotor Activity: Normal   Assets  Assets: Housing; Social Support; Resilience;  Physical Health; Communication Skills; Desire for Improvement; Financial Resources/Insurance; Vocational/Educational; Transportation   Sleep  Sleep: Good  Number of hours: No data recorded  Physical Exam: Physical Exam Constitutional:      General: She is not in acute distress.    Appearance: She is not ill-appearing, toxic-appearing or diaphoretic.  Eyes:     General: No scleral icterus. Cardiovascular:     Rate and Rhythm: Normal rate.  Pulmonary:     Effort: Pulmonary effort is normal. No respiratory distress.  Neurological:     Mental Status: She is alert and oriented to person, place, and time.  Psychiatric:        Attention and Perception: Attention and perception normal.        Mood and Affect: Affect normal. Mood is anxious.        Speech: Speech normal.        Behavior: Behavior normal. Behavior is cooperative.        Thought Content: Thought content normal.        Cognition and Memory: Cognition and memory normal.        Judgment: Judgment normal.    Review of Systems  Constitutional:  Negative for chills and fever.  Respiratory:  Negative for shortness of breath.   Cardiovascular:  Negative for chest pain and palpitations.  Gastrointestinal:  Negative for abdominal pain.  Neurological:        Seizure like activity   Psychiatric/Behavioral:  The patient is nervous/anxious.    Blood pressure (!) 148/95, pulse 99, temperature 98.1 F (36.7 C), temperature source Oral, resp. rate 18, SpO2 98 %. There is no height or weight on file to calculate BMI.  Musculoskeletal: Strength & Muscle Tone: within normal limits Gait & Station: normal Patient leans: N/A   [facility] MSE Discharge Disposition for Follow up and Recommendations: Based on my evaluation the patient does not appear to have an emergency medical condition and can be discharged with resources and follow up care in outpatient services for Medication Management and Individual Therapy   Lauree Chandler,  NP 09/28/2022, 2:05 PM

## 2022-09-28 NOTE — Discharge Instructions (Addendum)
Please come to open access hours at [facility] (this facility, [address] [place], [place], [place] [address]) to establish outpatient psychiatry and counseling services.   Open access hours for counseling are Monday, Wednesday, Thursday and start at 7:30AM. Please arrive early, such as by 7AM, to increase the likelihood of being seen the same day. Walk ins are seen first come first served. You may not be seen the same day you arrive.  Open access hours for medication management are Monday through Friday and start at 7:30AM. Please arrive early, such as by 7AM, to increase the likelihood of being seen the same day. Walk ins are seen first come first served. You may not be seen the same day you arrive.

## 2022-09-28 NOTE — Progress Notes (Signed)
   09/28/22 1315  [facility] Triage Screening (Walk-ins at [facility] only)  How Did You Hear About [place]? Primary Care  What Is the Reason for Your Visit/Call Today? Pt presents to [facility] voluntarily accompanied by her boyfriend. Pt states she was referred by her PCP Gwinda Passe at [facility] due to passive SI. Pt states she told her PCP she briefly experienced SI but does not feel this way at the moment. Pt denies any plan or intent to harm herself. Pt reports hx of seizures, last seizure was this past week. She has a treatment team through [facility] that assists her with finding providers and checks in with her weekly.Pt is interested in outpatient therapy and psychiatry. Pt denies HI and AVH.  How Long Has This Been Causing You Problems? <Week  Have You Recently Had Any Thoughts About Hurting Yourself? Yes  How long ago did you have thoughts about hurting yourself? passive this week  Are You Planning to Commit Suicide/Harm Yourself At This time? No  Have you Recently Had Thoughts About Hurting Someone Karolee Ohs? No  Are You Planning To Harm Someone At This Time? No  Are you currently experiencing any auditory, visual or other hallucinations? No  Have You Used Any Alcohol or Drugs in the Past 24 Hours? No  Do you have any current medical co-morbidities that require immediate attention? No  Clinician description of patient physical appearance/behavior: calm ,cooperative  What Do You Feel Would Help You the Most Today? Treatment for Depression or other mood problem  If access to [facility] was not available, would you have sought care in the Emergency Department? No  Determination of Need Routine (7 days)  Options For Referral Outpatient Therapy;Medication Management

## 2023-01-25 ENCOUNTER — Other Ambulatory Visit (HOSPITAL_COMMUNITY): Payer: Self-pay

## 2023-01-25 MED ORDER — WEGOVY 0.25 MG/0.5ML ~~LOC~~ SOAJ
0.5000 mL | SUBCUTANEOUS | 0 refills | Status: DC
  Filled 2023-01-25: qty 2, 28d supply, fill #0

## 2023-02-10 ENCOUNTER — Other Ambulatory Visit (HOSPITAL_COMMUNITY): Payer: Self-pay

## 2023-02-10 MED ORDER — HYDROXYZINE PAMOATE 50 MG PO CAPS
50.0000 mg | ORAL_CAPSULE | Freq: Three times a day (TID) | ORAL | 0 refills | Status: DC | PRN
  Filled 2023-02-10: qty 90, 30d supply, fill #0

## 2023-02-10 MED ORDER — VRAYLAR 1.5 MG PO CAPS
1.5000 mg | ORAL_CAPSULE | Freq: Every day | ORAL | 0 refills | Status: DC
  Filled 2023-02-10: qty 30, 30d supply, fill #0

## 2023-02-16 ENCOUNTER — Other Ambulatory Visit (HOSPITAL_COMMUNITY): Payer: Self-pay

## 2023-02-16 MED ORDER — WEGOVY 0.5 MG/0.5ML ~~LOC~~ SOAJ
0.5000 mg | SUBCUTANEOUS | 0 refills | Status: DC
  Filled 2023-02-16: qty 2, 28d supply, fill #0

## 2023-02-16 MED ORDER — ALBUTEROL SULFATE HFA 108 (90 BASE) MCG/ACT IN AERS
2.0000 | INHALATION_SPRAY | Freq: Four times a day (QID) | RESPIRATORY_TRACT | 5 refills | Status: AC | PRN
  Filled 2023-02-16: qty 18, 25d supply, fill #0
  Filled 2023-03-18 – 2023-04-13 (×2): qty 18, 25d supply, fill #1

## 2023-03-03 ENCOUNTER — Other Ambulatory Visit (HOSPITAL_COMMUNITY): Payer: Self-pay

## 2023-03-03 ENCOUNTER — Other Ambulatory Visit: Payer: Self-pay

## 2023-03-03 MED ORDER — VRAYLAR 3 MG PO CAPS
3.0000 mg | ORAL_CAPSULE | Freq: Every day | ORAL | 0 refills | Status: DC
  Filled 2023-03-03 – 2023-06-14 (×3): qty 30, 30d supply, fill #0

## 2023-03-03 MED ORDER — VENLAFAXINE HCL ER 37.5 MG PO CP24
37.5000 mg | ORAL_CAPSULE | Freq: Every day | ORAL | 0 refills | Status: DC
  Filled 2023-03-03 – 2023-06-14 (×4): qty 30, 30d supply, fill #0

## 2023-03-03 MED ORDER — HYDROXYZINE PAMOATE 50 MG PO CAPS
50.0000 mg | ORAL_CAPSULE | Freq: Three times a day (TID) | ORAL | 2 refills | Status: DC | PRN
  Filled 2023-03-03 – 2023-03-18 (×2): qty 90, 30d supply, fill #0

## 2023-03-04 ENCOUNTER — Other Ambulatory Visit: Payer: Self-pay

## 2023-03-04 ENCOUNTER — Other Ambulatory Visit (HOSPITAL_COMMUNITY): Payer: Self-pay

## 2023-03-10 ENCOUNTER — Other Ambulatory Visit (HOSPITAL_COMMUNITY): Payer: Self-pay

## 2023-03-15 ENCOUNTER — Other Ambulatory Visit (HOSPITAL_COMMUNITY): Payer: Self-pay

## 2023-03-16 ENCOUNTER — Other Ambulatory Visit (HOSPITAL_COMMUNITY): Payer: Self-pay

## 2023-03-16 MED ORDER — WEGOVY 1 MG/0.5ML ~~LOC~~ SOAJ
1.0000 mg | SUBCUTANEOUS | 0 refills | Status: DC
  Filled 2023-03-16 – 2023-04-13 (×2): qty 2, 28d supply, fill #0

## 2023-03-16 MED ORDER — WEGOVY 1 MG/0.5ML ~~LOC~~ SOAJ
0.5000 mL | SUBCUTANEOUS | 0 refills | Status: DC
  Filled 2023-03-16: qty 2, 28d supply, fill #0

## 2023-03-18 ENCOUNTER — Other Ambulatory Visit (HOSPITAL_COMMUNITY): Payer: Self-pay

## 2023-03-21 ENCOUNTER — Other Ambulatory Visit (HOSPITAL_BASED_OUTPATIENT_CLINIC_OR_DEPARTMENT_OTHER): Payer: Self-pay

## 2023-03-31 ENCOUNTER — Other Ambulatory Visit (HOSPITAL_COMMUNITY): Payer: Self-pay

## 2023-04-11 ENCOUNTER — Other Ambulatory Visit (HOSPITAL_COMMUNITY): Payer: Self-pay

## 2023-04-11 MED ORDER — VENLAFAXINE HCL ER 37.5 MG PO CP24
37.5000 mg | ORAL_CAPSULE | Freq: Every day | ORAL | 0 refills | Status: DC
  Filled 2023-04-11: qty 30, 30d supply, fill #0

## 2023-04-11 MED ORDER — VRAYLAR 3 MG PO CAPS
3.0000 mg | ORAL_CAPSULE | Freq: Every day | ORAL | 0 refills | Status: DC
  Filled 2023-04-11: qty 30, 30d supply, fill #0

## 2023-04-13 ENCOUNTER — Other Ambulatory Visit (HOSPITAL_COMMUNITY): Payer: Self-pay

## 2023-05-11 ENCOUNTER — Other Ambulatory Visit (HOSPITAL_COMMUNITY): Payer: Self-pay

## 2023-05-11 MED ORDER — WEGOVY 1.7 MG/0.75ML ~~LOC~~ SOAJ
1.7000 mg | SUBCUTANEOUS | 1 refills | Status: DC
  Filled 2023-05-11: qty 6, 56d supply, fill #0
  Filled 2023-05-11: qty 3, 28d supply, fill #0
  Filled 2023-06-14: qty 3, 28d supply, fill #1

## 2023-05-24 ENCOUNTER — Other Ambulatory Visit (HOSPITAL_COMMUNITY): Payer: Self-pay

## 2023-05-24 MED ORDER — VRAYLAR 3 MG PO CAPS
3.0000 mg | ORAL_CAPSULE | Freq: Every day | ORAL | 0 refills | Status: DC
  Filled 2023-05-24: qty 30, 30d supply, fill #0

## 2023-05-24 MED ORDER — VENLAFAXINE HCL ER 37.5 MG PO CP24
37.5000 mg | ORAL_CAPSULE | Freq: Every day | ORAL | 0 refills | Status: DC
  Filled 2023-05-24: qty 30, 30d supply, fill #0

## 2023-06-03 ENCOUNTER — Other Ambulatory Visit (HOSPITAL_COMMUNITY): Payer: Self-pay

## 2023-06-15 ENCOUNTER — Other Ambulatory Visit (HOSPITAL_COMMUNITY): Payer: Self-pay

## 2023-06-16 ENCOUNTER — Other Ambulatory Visit (HOSPITAL_COMMUNITY): Payer: Self-pay

## 2023-06-20 ENCOUNTER — Other Ambulatory Visit (HOSPITAL_COMMUNITY): Payer: Self-pay

## 2023-06-20 MED ORDER — VRAYLAR 3 MG PO CAPS
3.0000 mg | ORAL_CAPSULE | Freq: Every day | ORAL | 0 refills | Status: DC
  Filled 2023-06-20: qty 30, 30d supply, fill #0

## 2023-06-20 MED ORDER — VENLAFAXINE HCL ER 75 MG PO CP24
75.0000 mg | ORAL_CAPSULE | Freq: Every day | ORAL | 0 refills | Status: DC
  Filled 2023-06-20: qty 30, 30d supply, fill #0

## 2023-06-20 MED ORDER — HYDROXYZINE PAMOATE 50 MG PO CAPS
50.0000 mg | ORAL_CAPSULE | Freq: Four times a day (QID) | ORAL | 0 refills | Status: DC | PRN
  Filled 2023-06-20: qty 120, 30d supply, fill #0

## 2023-06-30 ENCOUNTER — Other Ambulatory Visit (HOSPITAL_COMMUNITY): Payer: Self-pay

## 2023-07-06 ENCOUNTER — Other Ambulatory Visit (HOSPITAL_COMMUNITY): Payer: Self-pay

## 2023-07-08 ENCOUNTER — Other Ambulatory Visit (HOSPITAL_COMMUNITY): Payer: Self-pay

## 2023-07-08 MED ORDER — WEGOVY 2.4 MG/0.75ML ~~LOC~~ SOAJ
2.4000 mg | SUBCUTANEOUS | 2 refills | Status: DC
  Filled 2023-07-08: qty 3, 28d supply, fill #0
  Filled 2023-07-29: qty 3, 28d supply, fill #1
  Filled 2023-08-31: qty 3, 28d supply, fill #2

## 2023-07-15 ENCOUNTER — Other Ambulatory Visit (HOSPITAL_COMMUNITY): Payer: Self-pay

## 2023-07-25 ENCOUNTER — Other Ambulatory Visit (HOSPITAL_COMMUNITY): Payer: Self-pay

## 2023-07-25 MED ORDER — VENLAFAXINE HCL ER 75 MG PO CP24
75.0000 mg | ORAL_CAPSULE | Freq: Every day | ORAL | 0 refills | Status: DC
  Filled 2023-07-25: qty 30, 30d supply, fill #0

## 2023-07-25 MED ORDER — VRAYLAR 4.5 MG PO CAPS
1.0000 | ORAL_CAPSULE | Freq: Every day | ORAL | 0 refills | Status: DC
  Filled 2023-07-25: qty 30, 30d supply, fill #0

## 2023-07-25 MED ORDER — HYDROXYZINE PAMOATE 50 MG PO CAPS
50.0000 mg | ORAL_CAPSULE | Freq: Four times a day (QID) | ORAL | 0 refills | Status: DC | PRN
  Filled 2023-07-25: qty 120, 30d supply, fill #0

## 2023-08-04 ENCOUNTER — Other Ambulatory Visit (HOSPITAL_COMMUNITY): Payer: Self-pay

## 2023-08-08 ENCOUNTER — Other Ambulatory Visit (HOSPITAL_COMMUNITY): Payer: Self-pay

## 2023-08-23 ENCOUNTER — Other Ambulatory Visit (HOSPITAL_COMMUNITY): Payer: Self-pay

## 2023-08-23 MED ORDER — LEVETIRACETAM 500 MG PO TABS
500.0000 mg | ORAL_TABLET | Freq: Two times a day (BID) | ORAL | 5 refills | Status: DC
  Filled 2023-08-23: qty 60, 30d supply, fill #0
  Filled 2023-09-23: qty 60, 30d supply, fill #1

## 2023-08-24 ENCOUNTER — Other Ambulatory Visit (HOSPITAL_COMMUNITY): Payer: Self-pay

## 2023-08-24 MED ORDER — VRAYLAR 4.5 MG PO CAPS
1.0000 | ORAL_CAPSULE | Freq: Every day | ORAL | 0 refills | Status: DC
Start: 2023-08-24 — End: 2023-11-18
  Filled 2023-08-24: qty 30, 30d supply, fill #0

## 2023-08-24 MED ORDER — HYDROXYZINE PAMOATE 50 MG PO CAPS
50.0000 mg | ORAL_CAPSULE | Freq: Four times a day (QID) | ORAL | 0 refills | Status: DC | PRN
  Filled 2023-08-24: qty 120, 30d supply, fill #0

## 2023-08-24 MED ORDER — VENLAFAXINE HCL ER 150 MG PO CP24
150.0000 mg | ORAL_CAPSULE | Freq: Every day | ORAL | 0 refills | Status: DC
Start: 2023-08-24 — End: 2023-12-27
  Filled 2023-08-24: qty 30, 30d supply, fill #0

## 2023-09-01 ENCOUNTER — Other Ambulatory Visit (HOSPITAL_COMMUNITY): Payer: Self-pay

## 2023-09-28 ENCOUNTER — Other Ambulatory Visit (HOSPITAL_COMMUNITY): Payer: Self-pay

## 2023-09-28 MED ORDER — TRIAMCINOLONE ACETONIDE 0.1 % EX CREA
TOPICAL_CREAM | Freq: Two times a day (BID) | CUTANEOUS | 0 refills | Status: DC
  Filled 2023-09-28 – 2023-10-21 (×2): qty 45, 30d supply, fill #0

## 2023-09-28 MED ORDER — WEGOVY 2.4 MG/0.75ML ~~LOC~~ SOAJ
2.4000 mg | SUBCUTANEOUS | 2 refills | Status: DC
  Filled 2023-09-28 – 2023-10-21 (×2): qty 3, 28d supply, fill #0

## 2023-10-03 ENCOUNTER — Other Ambulatory Visit (HOSPITAL_COMMUNITY): Payer: Self-pay

## 2023-10-10 ENCOUNTER — Other Ambulatory Visit (HOSPITAL_COMMUNITY): Payer: Self-pay

## 2023-10-21 ENCOUNTER — Other Ambulatory Visit (HOSPITAL_COMMUNITY): Payer: Self-pay

## 2023-11-18 ENCOUNTER — Other Ambulatory Visit: Payer: Self-pay

## 2023-11-18 ENCOUNTER — Other Ambulatory Visit (HOSPITAL_COMMUNITY): Payer: Self-pay

## 2023-11-18 ENCOUNTER — Ambulatory Visit: Payer: MEDICAID | Admitting: Obstetrics and Gynecology

## 2023-11-18 VITALS — BP 140/92 | HR 92 | Ht 63.0 in | Wt 322.8 lb

## 2023-11-18 DIAGNOSIS — Z3201 Encounter for pregnancy test, result positive: Secondary | ICD-10-CM | POA: Diagnosis not present

## 2023-11-18 LAB — POCT PREGNANCY, URINE: Preg Test, Ur: POSITIVE — AB

## 2023-11-18 MED ORDER — PRENATAL VITAMIN 27-0.8 MG PO TABS
1.0000 | ORAL_TABLET | Freq: Every day | ORAL | 11 refills | Status: AC
  Filled 2023-11-18 – 2023-11-30 (×2): qty 100, 100d supply, fill #0
  Filled 2023-12-19: qty 30, 30d supply, fill #0
  Filled 2024-01-30 – 2024-03-19 (×2): qty 30, 30d supply, fill #1
  Filled 2024-05-24: qty 30, 30d supply, fill #2

## 2023-11-18 NOTE — Patient Instructions (Addendum)

## 2023-11-18 NOTE — Progress Notes (Signed)
 Possible Pregnancy  Here today for pregnancy confirmation. UPT in office today is positive. Pt reports first positive home UPT on 11/11/23. Reviewed dating with patient:   LMP: 09/14/23  EDD: 06/20/24 9w 2d today  OB history reviewed. Reviewed medications and allergies with patient; list of medications safe to take during pregnancy given.  Recommended pt begin prenatal vitamin and schedule prenatal care.  Pt undecided of where she would like to receive prenatal care.  Provided list of OBGYN providers in [place] area.  Pt verbalized understanding with no further questions.  Waddell LITTIE Burows, RN 11/18/2023  8:38 AM

## 2023-11-29 ENCOUNTER — Other Ambulatory Visit (HOSPITAL_COMMUNITY): Payer: Self-pay

## 2023-11-29 DIAGNOSIS — Z331 Pregnant state, incidental: Secondary | ICD-10-CM | POA: Insufficient documentation

## 2023-11-29 DIAGNOSIS — O0990 Supervision of high risk pregnancy, unspecified, unspecified trimester: Secondary | ICD-10-CM | POA: Insufficient documentation

## 2023-11-29 MED ORDER — LEVETIRACETAM ER 500 MG PO TB24
1000.0000 mg | ORAL_TABLET | Freq: Every day | ORAL | 5 refills | Status: DC
  Filled 2023-11-29 – 2024-03-19 (×2): qty 30, 15d supply, fill #0

## 2023-11-30 ENCOUNTER — Other Ambulatory Visit (HOSPITAL_COMMUNITY): Payer: Self-pay

## 2023-12-09 ENCOUNTER — Other Ambulatory Visit (HOSPITAL_COMMUNITY): Payer: Self-pay

## 2023-12-14 ENCOUNTER — Telehealth: Payer: MEDICAID

## 2023-12-14 DIAGNOSIS — Z3401 Encounter for supervision of normal first pregnancy, first trimester: Secondary | ICD-10-CM

## 2023-12-14 DIAGNOSIS — Z3A13 13 weeks gestation of pregnancy: Secondary | ICD-10-CM

## 2023-12-14 DIAGNOSIS — O3680X0 Pregnancy with inconclusive fetal viability, not applicable or unspecified: Secondary | ICD-10-CM

## 2023-12-14 NOTE — Patient Instructions (Signed)

## 2023-12-14 NOTE — Progress Notes (Signed)
 New OB Intake  I connected with Angelica Holmes  on 12/14/23 at  9:15 AM EDT by MyChart Video Visit and verified that I am speaking with the correct person using two identifiers. Nurse is located at [facility] and pt is located at home.  I discussed the limitations, risks, security and privacy concerns of performing an evaluation and management service by telephone and the availability of in person appointments. I also discussed with the patient that there may be a patient responsible charge related to this service. The patient expressed understanding and agreed to proceed.  I explained I am completing New OB Intake today. We discussed EDD of 06/20/24 based on LMP of 09/14/23. Pt is G1P0. I reviewed her allergies, medications and Medical/Surgical/OB history.    Patient Active Problem List   Diagnosis Date Noted   Encounter for supervision of normal first pregnancy in first trimester 12/14/2023   Depression    Anxiety    PTSD (post-traumatic stress disorder)    Adjustment disorder with mixed anxiety and depressed mood      Concerns addressed today  Delivery Plans Plans to deliver at [facility]. Discussed the nature of our practice with multiple providers including residents and students. Due to the size of the practice, the delivering provider may not be the same as those providing prenatal care.   Patient is not interested in water birth.  MyChart/Babyscripts MyChart access verified. I explained pt will have some visits in office and some virtually. Babyscripts instructions given and order placed. Patient verifies receipt of registration text/e-mail. Account successfully created and app downloaded. If patient is a candidate for Optimized scheduling, add to sticky note.   Blood Pressure Cuff/Weight Scale Pt has own BP Cuff,  Explained after first prenatal appt pt will check weekly and document in Babyscripts. Patient does have weight scale.  Anatomy US  Explained first scheduled US  will be  around 19 weeks. Anatomy US  scheduled for 02/01/24 at 0810a.  Is patient a CenteringPregnancy candidate?   [facility]  patient a Mom+Baby Combined Care candidate?  Accepted   If accepted, confirm patient does not intend to move from the area for at least 12 months, then notify Mom+Baby staff  Is patient a candidate for Babyscripts Optimization?    First visit review I reviewed new OB appt with patient. Explained pt will be seen by Angelica Carolin, MD at first visit. Discussed Angelica Holmes genetic screening with patient. Needs Panorama and Horizon.. Routine prenatal labs needed at [facility] OB visit.   Last Pap Needs  Angelica Holmes Mulch, CMA 12/14/2023  9:55 AM

## 2023-12-19 ENCOUNTER — Other Ambulatory Visit (HOSPITAL_COMMUNITY): Payer: Self-pay

## 2023-12-26 ENCOUNTER — Other Ambulatory Visit: Payer: Self-pay

## 2023-12-26 ENCOUNTER — Encounter: Payer: Self-pay | Admitting: Obstetrics and Gynecology

## 2023-12-26 ENCOUNTER — Ambulatory Visit: Payer: MEDICAID | Admitting: Obstetrics and Gynecology

## 2023-12-26 ENCOUNTER — Other Ambulatory Visit: Payer: MEDICAID

## 2023-12-26 ENCOUNTER — Ambulatory Visit: Payer: MEDICAID

## 2023-12-26 ENCOUNTER — Other Ambulatory Visit (HOSPITAL_COMMUNITY)
Admission: RE | Admit: 2023-12-26 | Discharge: 2023-12-26 | Disposition: A | Discharge status: Home / Self Care | Payer: MEDICAID | Source: Ambulatory Visit | Attending: Obstetrics and Gynecology | Admitting: Obstetrics and Gynecology

## 2023-12-26 VITALS — BP 135/88 | HR 77 | Wt 323.6 lb

## 2023-12-26 DIAGNOSIS — Z1331 Encounter for screening for depression: Secondary | ICD-10-CM | POA: Diagnosis not present

## 2023-12-26 DIAGNOSIS — F4310 Post-traumatic stress disorder, unspecified: Secondary | ICD-10-CM

## 2023-12-26 DIAGNOSIS — O3680X0 Pregnancy with inconclusive fetal viability, not applicable or unspecified: Secondary | ICD-10-CM

## 2023-12-26 DIAGNOSIS — Z3A11 11 weeks gestation of pregnancy: Secondary | ICD-10-CM | POA: Diagnosis not present

## 2023-12-26 DIAGNOSIS — Z3490 Encounter for supervision of normal pregnancy, unspecified, unspecified trimester: Secondary | ICD-10-CM | POA: Insufficient documentation

## 2023-12-26 DIAGNOSIS — O0991 Supervision of high risk pregnancy, unspecified, first trimester: Secondary | ICD-10-CM

## 2023-12-26 DIAGNOSIS — R569 Unspecified convulsions: Secondary | ICD-10-CM | POA: Diagnosis not present

## 2023-12-26 DIAGNOSIS — Z3491 Encounter for supervision of normal pregnancy, unspecified, first trimester: Secondary | ICD-10-CM | POA: Diagnosis not present

## 2023-12-26 DIAGNOSIS — Z6841 Body Mass Index (BMI) 40.0 and over, adult: Secondary | ICD-10-CM

## 2023-12-26 DIAGNOSIS — Z3400 Encounter for supervision of normal first pregnancy, unspecified trimester: Secondary | ICD-10-CM

## 2023-12-26 DIAGNOSIS — O0990 Supervision of high risk pregnancy, unspecified, unspecified trimester: Secondary | ICD-10-CM

## 2023-12-27 ENCOUNTER — Other Ambulatory Visit (HOSPITAL_COMMUNITY): Payer: Self-pay

## 2023-12-27 ENCOUNTER — Encounter: Payer: Self-pay | Admitting: Obstetrics and Gynecology

## 2023-12-27 ENCOUNTER — Ambulatory Visit: Payer: Self-pay | Admitting: Obstetrics and Gynecology

## 2023-12-27 DIAGNOSIS — R569 Unspecified convulsions: Secondary | ICD-10-CM | POA: Insufficient documentation

## 2023-12-27 DIAGNOSIS — Z6841 Body Mass Index (BMI) 40.0 and over, adult: Secondary | ICD-10-CM | POA: Insufficient documentation

## 2023-12-27 DIAGNOSIS — O0990 Supervision of high risk pregnancy, unspecified, unspecified trimester: Secondary | ICD-10-CM

## 2023-12-27 LAB — CBC/D/PLT+RPR+RH+ABO+RUBIGG...
Antibody Screen: NEGATIVE
Basophils Absolute: 0 x10E3/uL (ref 0.0–0.2)
Basos: 0 %
EOS (ABSOLUTE): 0 x10E3/uL (ref 0.0–0.4)
Eos: 1 %
HCV Ab: NONREACTIVE
HIV Screen 4th Generation wRfx: NONREACTIVE
Hematocrit: 36.4 % (ref 34.0–46.6)
Hemoglobin: 12.2 g/dL (ref 11.1–15.9)
Hepatitis B Surface Ag: NEGATIVE
Immature Grans (Abs): 0 x10E3/uL (ref 0.0–0.1)
Immature Granulocytes: 0 %
Lymphocytes Absolute: 2.1 x10E3/uL (ref 0.7–3.1)
Lymphs: 33 %
MCH: 29.8 pg (ref 26.6–33.0)
MCHC: 33.5 g/dL (ref 31.5–35.7)
MCV: 89 fL (ref 79–97)
Monocytes Absolute: 0.3 x10E3/uL (ref 0.1–0.9)
Monocytes: 5 %
Neutrophils Absolute: 3.9 x10E3/uL (ref 1.4–7.0)
Neutrophils: 61 %
Platelets: 283 x10E3/uL (ref 150–450)
RBC: 4.09 x10E6/uL (ref 3.77–5.28)
RDW: 13.8 % (ref 11.7–15.4)
RPR Ser Ql: NONREACTIVE
Rh Factor: POSITIVE
Rubella Antibodies, IGG: 2.95 {index} (ref >0.99)
WBC: 6.3 x10E3/uL (ref 3.4–10.8)

## 2023-12-27 LAB — HCV INTERPRETATION

## 2023-12-27 LAB — HEMOGLOBIN A1C
Est. average glucose Bld gHb Est-mCnc: 103 mg/dL
Hgb A1c MFr Bld: 5.2 % (ref 4.8–5.6)

## 2023-12-27 MED ORDER — ASPIRIN 81 MG PO TBEC
81.0000 mg | DELAYED_RELEASE_TABLET | Freq: Every day | ORAL | 3 refills | Status: DC
  Filled 2023-12-27: qty 90, 90d supply, fill #0
  Filled 2024-03-19: qty 90, 90d supply, fill #1
  Filled 2024-05-24 – 2024-05-28 (×2): qty 90, 90d supply, fill #2

## 2023-12-27 NOTE — Progress Notes (Signed)
 Subjective:   LINDSIE SIMAR is a 23 y.o. G1P0 at [redacted]w[redacted]d by early ultrasound being seen today for her first obstetrical visit.  Her obstetrical history is significant for seizures, pre-pregnancy BMI 57. Patient does intend to breast feed. Pregnancy history fully reviewed.  Patient reports doing well overall.  HISTORY: OB History  Gravida Para Term Preterm AB Living  1 0 0 0 0 0  SAB IAB Ectopic Multiple Live Births  0 0 0 0 0    # Outcome Date GA Lbr Len/2nd Weight Sex Type Anes PTL Lv  1 Current              Last pap smear: No results found for: DIAGPAP, HPV, HPVHIGH  Past Medical History:  Diagnosis Date   Anxiety    Depression    PTSD (post-traumatic stress disorder)    No past surgical history on file. No family history on file. Social History   Tobacco Use   Smoking status: Never   Smokeless tobacco: Never  Substance Use Topics   Alcohol use: Never   Drug use: Never   No Known Allergies Current Outpatient Medications on File Prior to Visit  Medication Sig Dispense Refill   albuterol  (VENTOLIN  HFA) 108 (90 Base) MCG/ACT inhaler Inhale 2 puffs into the lungs every 6 (six) hours as needed for wheezing or shortness of breath.     levETIRAcetam  (KEPPRA  XR) 500 MG 24 hr tablet Take 2 tablets (1,000 mg total) by mouth daily. 30 tablet 5   Prenatal Vit-Fe Fumarate-FA (PRENATAL VITAMIN) 27-0.8 MG TABS Take 1 tablet by mouth daily. 30 tablet 11   albuterol  (VENTOLIN  HFA) 108 (90 Base) MCG/ACT inhaler Inhale 2 puffs into the lungs every 6 (six) hours as needed for wheezing 18 g 5   No current facility-administered medications on file prior to visit.   Exam   Vitals:   12/26/23 1414  BP: 135/88  Pulse: 77  Weight: (!) 323 lb 9.6 oz (146.8 kg)   Fetal Heart Rate (bpm): 155  General:  Alert, oriented and cooperative. Patient is in no acute distress.  Cardiovascular: Normal heart rate noted  Respiratory: Normal respiratory effort, no problems with  respiration noted   Assessment:   Pregnancy: G1P0 Patient Active Problem List   Diagnosis Date Noted   BMI 50.0-59.9, adult (HCC) 12/27/2023   Seizures (HCC) 12/27/2023   Supervision of high risk pregnancy, antepartum 11/29/2023   Recurrent depressive disorder, current episode mild (HCC) 04/29/2019   Anxiety disorder 03/13/2019   Post traumatic stress disorder (PTSD) 03/13/2019     Plan:  1. Supervision of high risk pregnancy, antepartum 2. [redacted] weeks gestation of pregnancy (Primary) Initial labs drawn. Declines pap today- has a friend that miscarried after a pap. Counseled that pap would not cause miscarriage, but that in first trimester miscarriage is common. She is open to doing a pap next visit Continue prenatal vitamins. Genetic Screening discussed: NIPS, carrier screening ordered. Ultrasound discussed; fetal anatomic survey: ordered. Problem list reviewed and updated. The nature of [facility] Practice with multiple MDs and other Advanced Practice Providers was explained to patient; also emphasized that residents, students are part of our team. Routine obstetric precautions reviewed. - PANORAMA PRENATAL TEST - HORIZON Basic Panel - Culture, OB Urine - CBC/D/Plt+RPR+Rh+ABO+RubIgG... - Hemoglobin A1c - GC/Chlamydia probe amp ()not at [facility]  3. BMI 50.0-59.9, adult (HCC) ldASA at 12 weeks, rx sent  4. 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  Kieth Carolin, MD Obstetrician & Gynecologist, [facility] for [employer], [facility]

## 2023-12-28 LAB — URINE CULTURE, OB REFLEX

## 2023-12-28 LAB — GC/CHLAMYDIA PROBE AMP (~~LOC~~) NOT AT ARMC
Chlamydia: NEGATIVE
Comment: NEGATIVE
Comment: NORMAL
Neisseria Gonorrhea: NEGATIVE

## 2023-12-28 LAB — CULTURE, OB URINE

## 2024-01-01 LAB — PANORAMA PRENATAL TEST FULL PANEL:PANORAMA TEST PLUS 5 ADDITIONAL MICRODELETIONS: FETAL FRACTION: 7.3

## 2024-01-02 LAB — HORIZON CUSTOM: REPORT SUMMARY: NEGATIVE

## 2024-01-05 ENCOUNTER — Encounter: Payer: Self-pay | Admitting: Obstetrics and Gynecology

## 2024-01-24 ENCOUNTER — Encounter: Payer: MEDICAID | Admitting: Obstetrics and Gynecology

## 2024-01-30 ENCOUNTER — Other Ambulatory Visit (HOSPITAL_COMMUNITY): Payer: Self-pay

## 2024-01-30 ENCOUNTER — Other Ambulatory Visit: Payer: Self-pay

## 2024-01-30 ENCOUNTER — Ambulatory Visit (INDEPENDENT_AMBULATORY_CARE_PROVIDER_SITE_OTHER): Payer: MEDICAID | Admitting: Family Medicine

## 2024-01-30 VITALS — BP 135/86 | HR 90 | Wt 323.0 lb

## 2024-01-30 DIAGNOSIS — Z3A16 16 weeks gestation of pregnancy: Secondary | ICD-10-CM

## 2024-01-30 DIAGNOSIS — O0990 Supervision of high risk pregnancy, unspecified, unspecified trimester: Secondary | ICD-10-CM

## 2024-01-30 DIAGNOSIS — Z6841 Body Mass Index (BMI) 40.0 and over, adult: Secondary | ICD-10-CM

## 2024-01-30 DIAGNOSIS — R569 Unspecified convulsions: Secondary | ICD-10-CM

## 2024-01-30 DIAGNOSIS — F419 Anxiety disorder, unspecified: Secondary | ICD-10-CM

## 2024-01-30 NOTE — Progress Notes (Signed)
   PRENATAL VISIT NOTE  Subjective:  Angelica Holmes is a 23 y.o. G1P0 at [redacted]w[redacted]d being seen today for ongoing prenatal care.  She is currently monitored for the following issues for this low-risk pregnancy and has Recurrent depressive disorder, current episode mild (HCC); Anxiety disorder; Post traumatic stress disorder (PTSD); Supervision of high risk pregnancy, antepartum; BMI 50.0-59.9, adult (HCC); and Seizures (HCC) on their problem list.  Patient reports no complaints.  Contractions: Not present. Vag. Bleeding: None.  Movement: Absent. Denies leaking of fluid.   The following portions of the patient's history were reviewed and updated as appropriate: allergies, current medications, past family history, past medical history, past social history, past surgical history and problem list.   Objective:    Vitals:   01/30/24 1637  BP: 135/86  Pulse: 90  Weight: (!) 323 lb (146.5 kg)    Fetal Status:  Fetal Heart Rate (bpm): 160   Movement: Absent    General: Alert, oriented and cooperative. Patient is in no acute distress.  Skin: Skin is warm and dry. No rash noted.   Cardiovascular: Normal heart rate noted  Respiratory: Normal respiratory effort, no problems with respiration noted  Abdomen: Soft, gravid, appropriate for gestational age.  Pain/Pressure: Absent     Pelvic: Cervical exam deferred        Extremities: Normal range of motion.  Edema: None  Mental Status: Normal mood and affect. Normal behavior. Normal judgment and thought content.   Assessment and Plan:  Pregnancy: G1P0 at [redacted]w[redacted]d 1. Anxiety disorder, unspecified type (Primary) Encouraged movement and activity as tolerated.  2. Supervision of high risk pregnancy, antepartum Up to date Talked about pap smear, desires PP due to friend that had a colpo and had miscarriage. Discussed pap in detail and cervical cancer screening.  Feeling some flutters but not sure if it is true movement US  used to get FHR-- visualized  movement Concerns: none Reviewed [facility] care in general and encouraged visits with team Desired AFP but unable to draw due to lab availability, will get at next visit  3. Seizures (HCC) No sz since last visit Continues to take Keppra   4. BMI 50.0-59.9, adult (HCC) TWG=1 lb (0.454 kg) which is within goal  5. [redacted] weeks gestation of pregnancy   Preterm labor symptoms and general obstetric precautions including but not limited to vaginal bleeding, contractions, leaking of fluid and fetal movement were reviewed in detail with the patient. Please refer to After Visit Summary for other counseling recommendations.   No follow-ups on file.  Future Appointments  Date Time Provider Department Center  02/24/2024  9:00 AM [facility] PROVIDER 1 [facility]  02/24/2024  9:30 AM [facility] US4 [facility]  02/29/2024  3:15 PM Eldonna Suzen Octave, MD [facility]  04/25/2024  3:55 PM [facility] PROVIDER [facility]    Suzen Octave Eldonna, MD

## 2024-02-01 ENCOUNTER — Ambulatory Visit: Payer: MEDICAID

## 2024-02-01 ENCOUNTER — Other Ambulatory Visit: Payer: MEDICAID

## 2024-02-04 IMAGING — CT CT HEAD W/O CM
3 series · 15 of 47 positions shown, 18 images · non-contrast
Comparison: None Available.

CLINICAL DATA: Seizure.



[Series 2: head wo · axial · 0.42mm/px · z∈[-144,-14]mm · 9 of 32 slices shown, 12 images]
[im 3/32  brain]
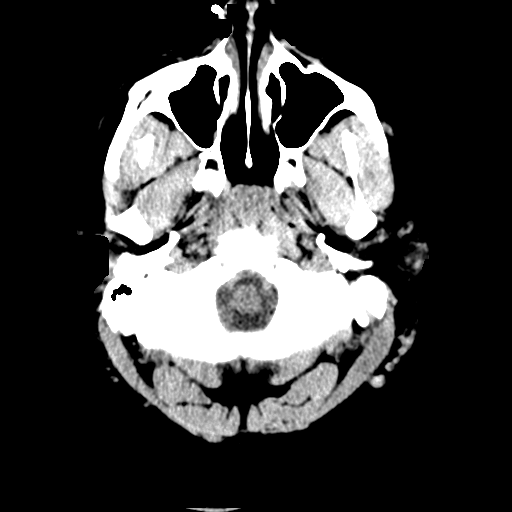
[im 3/32  bone]
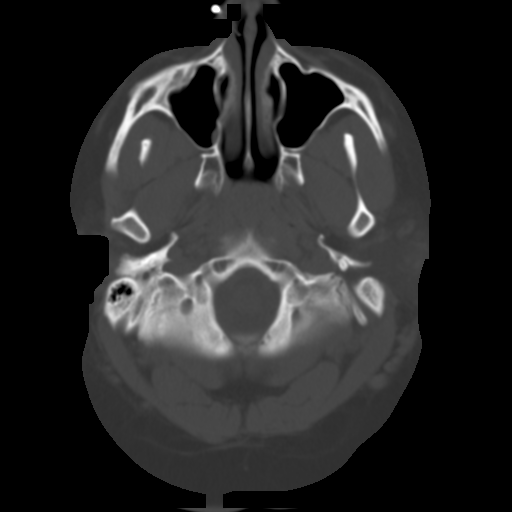
[im 6/32  brain]
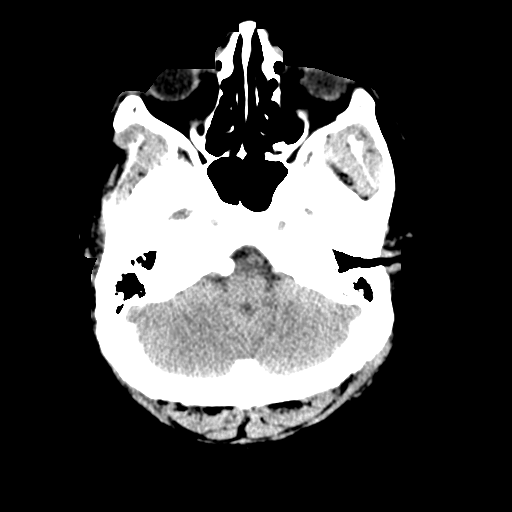
[im 9/32  brain]
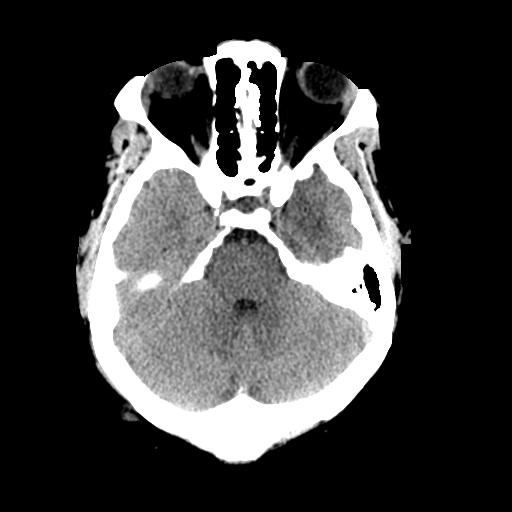
[im 12/32  brain]
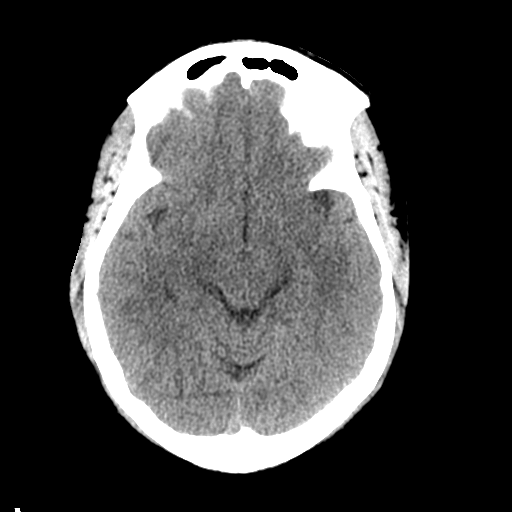
[im 17/32  brain]
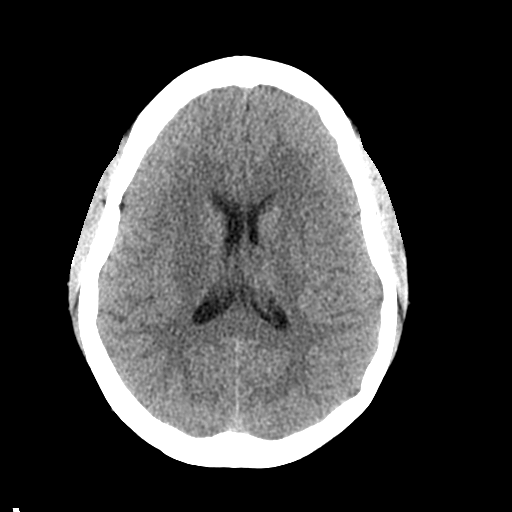
[im 17/32  bone]
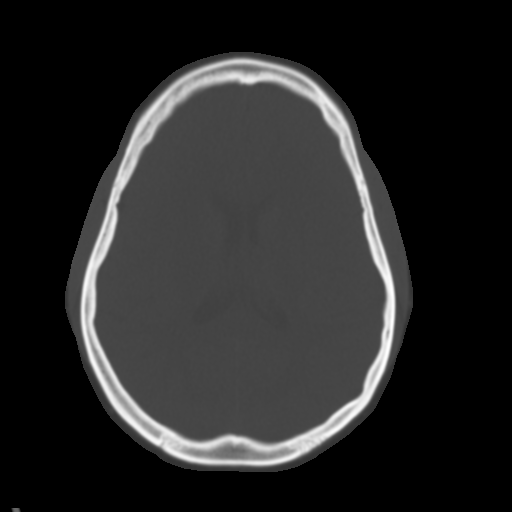
[im 20/32  brain]
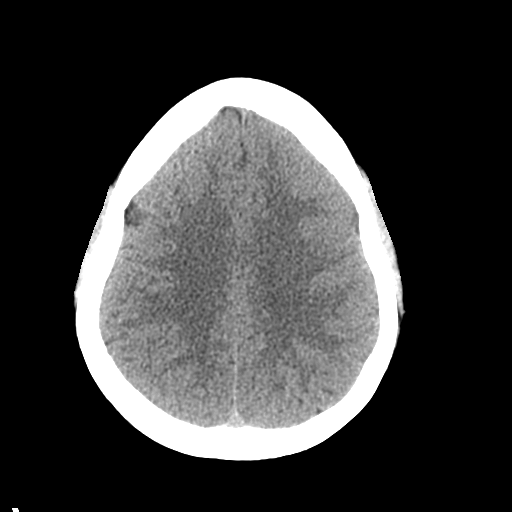
[im 23/32  brain]
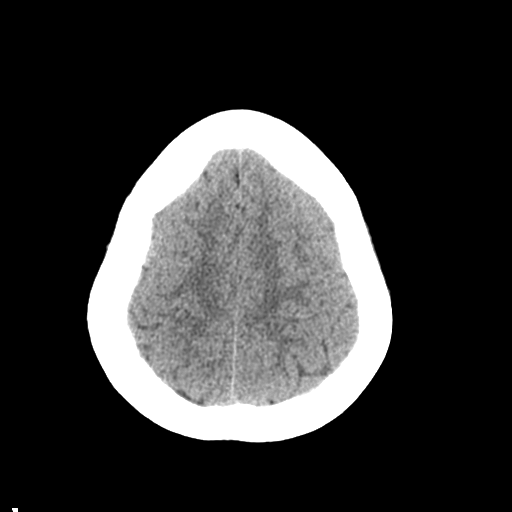
[im 26/32  brain]
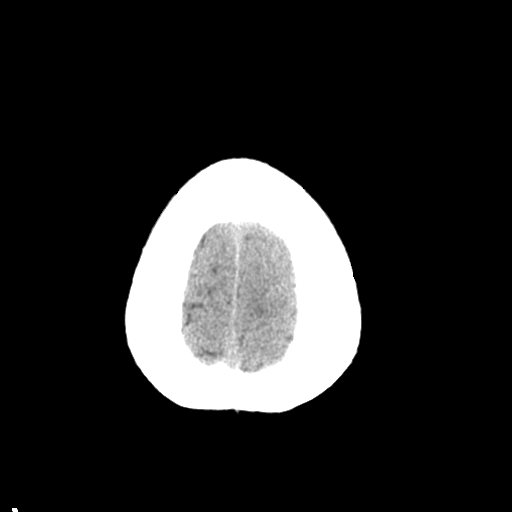
[im 29/32  brain]
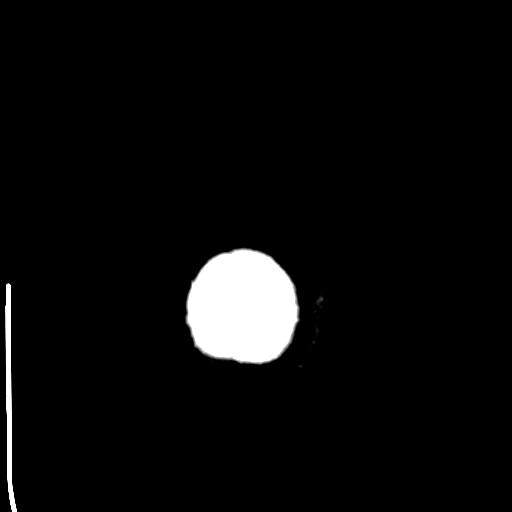
[im 29/32  bone]
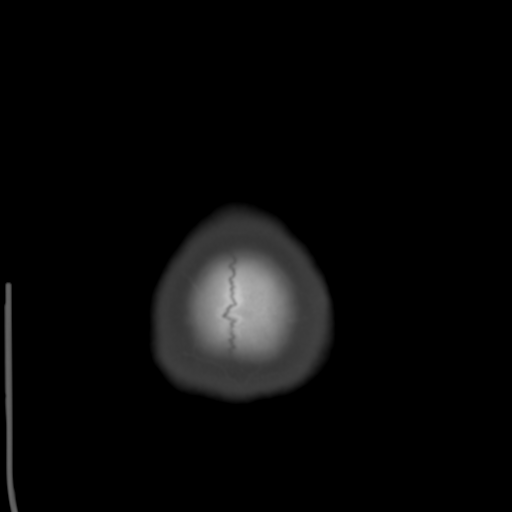

[Series 5: coronal soft tissue · coronal · 0.32mm/px · 3 of 74 slices shown]
[im 25/74  brain]
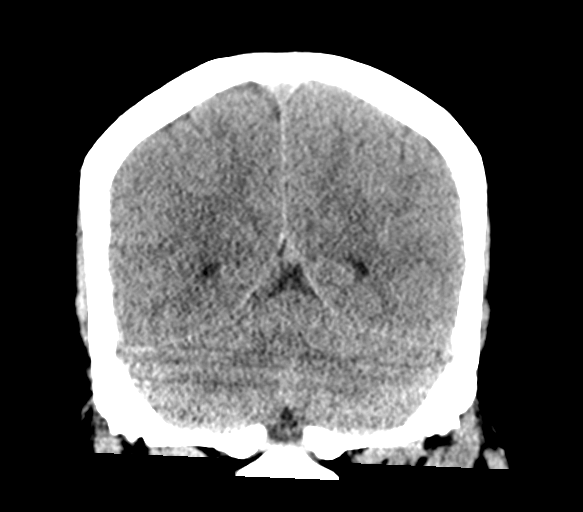
[im 33/74  brain]
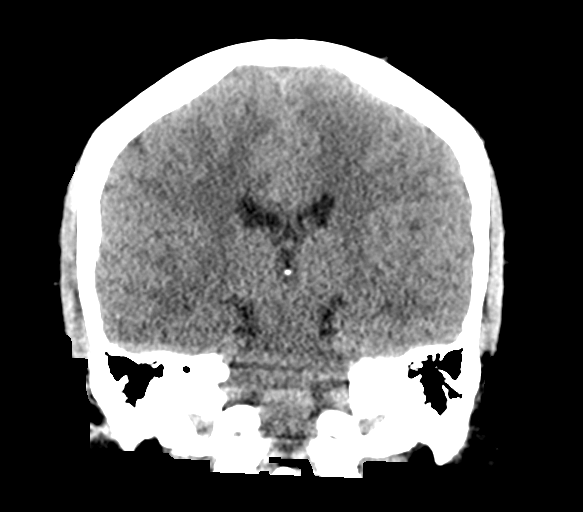
[im 41/74  brain]
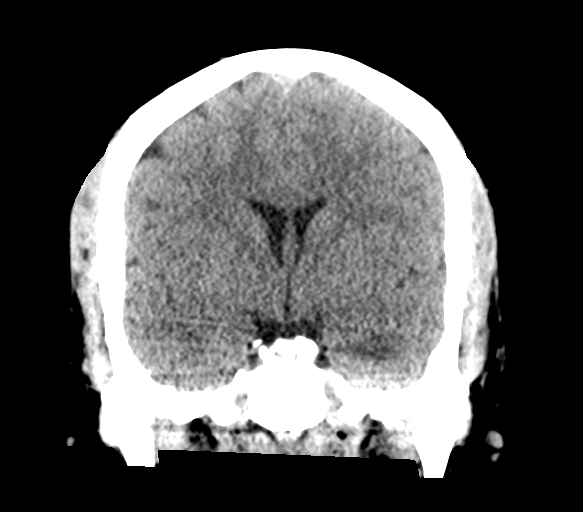

[Series 6: sagittal soft tissue · sagittal · 0.31mm/px · 3 of 60 slices shown]
[im 20/60  brain]
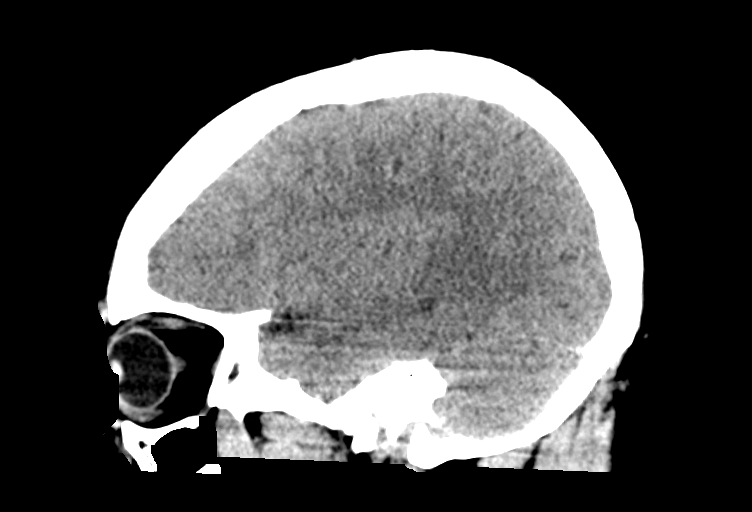
[im 30/60  brain]
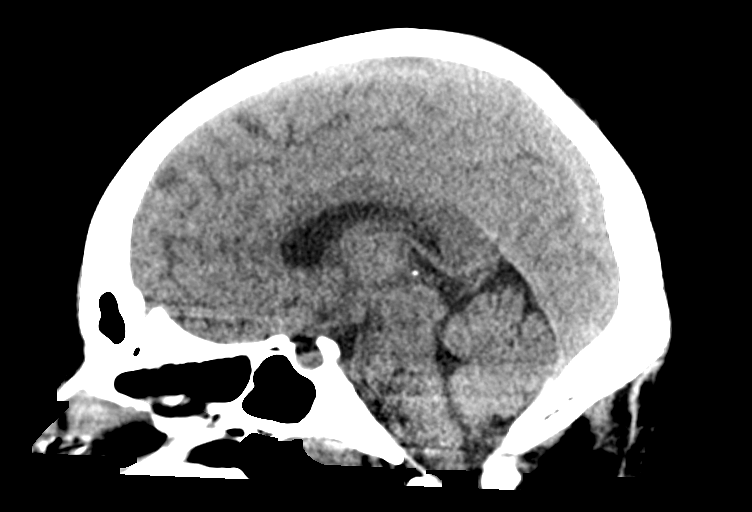
[im 40/60  brain]
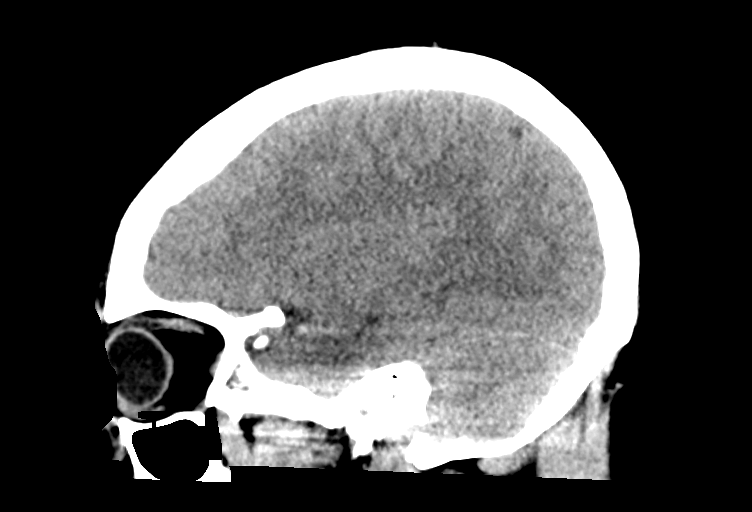

[15 of 47 positions shown; findings below may reference images not displayed]

FINDINGS: Brain: The ventricles and sulci are appropriate size for the
patient's age. The gray-white matter discrimination is preserved.
There is no acute intracranial hemorrhage. No mass effect or midline
shift. No extra-axial fluid collection.

Vascular: No hyperdense vessel or unexpected calcification.

Skull: Normal. Negative for fracture or focal lesion.

Sinuses/Orbits: No acute finding.

Other: None
IMPRESSION: Unremarkable noncontrast CT of the brain.

## 2024-02-08 ENCOUNTER — Other Ambulatory Visit (HOSPITAL_COMMUNITY): Payer: Self-pay

## 2024-02-24 ENCOUNTER — Other Ambulatory Visit: Payer: Self-pay | Admitting: *Deleted

## 2024-02-24 ENCOUNTER — Ambulatory Visit: Payer: Self-pay

## 2024-02-24 ENCOUNTER — Ambulatory Visit: Payer: MEDICAID | Attending: Obstetrics and Gynecology | Admitting: Obstetrics and Gynecology

## 2024-02-24 VITALS — BP 134/89 | HR 106

## 2024-02-24 DIAGNOSIS — Z3401 Encounter for supervision of normal first pregnancy, first trimester: Secondary | ICD-10-CM | POA: Diagnosis not present

## 2024-02-24 DIAGNOSIS — E66813 Obesity, class 3: Secondary | ICD-10-CM | POA: Diagnosis not present

## 2024-02-24 DIAGNOSIS — Z6841 Body Mass Index (BMI) 40.0 and over, adult: Secondary | ICD-10-CM

## 2024-02-24 DIAGNOSIS — O99352 Diseases of the nervous system complicating pregnancy, second trimester: Secondary | ICD-10-CM | POA: Diagnosis not present

## 2024-02-24 DIAGNOSIS — G40909 Epilepsy, unspecified, not intractable, without status epilepticus: Secondary | ICD-10-CM | POA: Diagnosis not present

## 2024-02-24 DIAGNOSIS — O99212 Obesity complicating pregnancy, second trimester: Secondary | ICD-10-CM | POA: Diagnosis present

## 2024-02-24 DIAGNOSIS — O358XX0 Maternal care for other (suspected) fetal abnormality and damage, not applicable or unspecified: Secondary | ICD-10-CM

## 2024-02-24 DIAGNOSIS — Z363 Encounter for antenatal screening for malformations: Secondary | ICD-10-CM | POA: Insufficient documentation

## 2024-02-24 DIAGNOSIS — R569 Unspecified convulsions: Secondary | ICD-10-CM | POA: Diagnosis not present

## 2024-02-24 DIAGNOSIS — O26892 Other specified pregnancy related conditions, second trimester: Secondary | ICD-10-CM

## 2024-02-24 DIAGNOSIS — Z3A20 20 weeks gestation of pregnancy: Secondary | ICD-10-CM | POA: Insufficient documentation

## 2024-02-24 DIAGNOSIS — O0990 Supervision of high risk pregnancy, unspecified, unspecified trimester: Secondary | ICD-10-CM

## 2024-02-24 NOTE — Progress Notes (Signed)
 Maternal-Fetal Medicine Consultation  Name: Angelica Holmes  MRN: [id number]  GA: G1P0 [redacted]w[redacted]d   Patient is here for fetal anatomy scan.  Her high risk problems include: - Seizure disorder.  Patient reports that she had a first seizure in October 2022 that was witnessed by her boyfriend.  She had a neurology consultation on 11/29/2023 with a neurologist, Dr. Kayla Shutter.  From his note, I read that she has unspecified seizure.  He had increased Keppra  extended-release dosage to 1000 mg daily.  Patient reports she had another episode of seizure last week and was witnessed by her boyfriend, but no video was taken.  She did not have any injuries. - Pregravid BMI 57. On cell-free fetal DNA screening, the risks of fetal aneuploidies are not increased.  Her pregnancy is dated by 11-week ultrasound. Ultrasound-fetal anatomical survey Fetal biometry is consistent with the previously established dates.  Normal amniotic fluid. No markers of aneuploidies or obvious fetal structural defects are seen. Patient understands the limitations of ultrasound in detecting fetal anomalies. Our concerns include: Epilepsy and pregnancy. We discussed some general principles in the management of pregnancy in women with epilepsy and taking antiseizure medications (ASM). -Epilepsy is present in about 1% of general population. Many women start their pregnancies while on ASM. Patient has a neurologist who recommended the current dosage of Keppra .  - The likelihood of having a seizure-free pregnancy is high (greater than 90%) if the patient has been seizure-free for several months. Patient started her pregnancy after having been free of seizures for several months. She had recurrent seizure last week. -Drug levels alter in pregnancy because of hemodilution. Dosage may need to be increased based on drug levels and decreased in the postpartum period.  -Epilepsy is associated with pregnancy complications including fetal growth  restriction and preterm delivery. Preeclampsia is less likely. We expect well-controlled epilepsy should be associated with good outcomes.  -ASM is associated with fetal congenital malformations, and they are drug-specific. In general, polytherapy (as opposed to monotherapy) is associated with an increased incidence of fetal congenital malformations. Polytherapy with valproate (Depakote) and topiramate (higher dose) are associated with increased malformations.   -Many other ASM including levetiracetam  (Keppra ) are not associated with increased congenital malformations.   -We recommend serial fetal growth assessments in pregnancy. Antenatal testing may start at 32 weeks if patient has seizures in pregnancy.  -Vaginal delivery is not contraindicated. About 3% to 4% of women will have seizures during labor, but it is less likely if seizures have been well controlled.  -Folic acid: I recommend taking folic acid 1 milligram daily in addition to prenatal vitamins.   Pregravid BMI  Grade 3 obesity is independently associated with increased risk of stillbirth (2.5- to 3-fold), but the absolute risk is very small.  I discussed protocol of weekly antenatal testing from [redacted] weeks gestation until delivery. Obesity is associated with increased risk for gestational diabetes and gestational hypertension. I encouraged her to continue low-dose aspirin  prophylaxis that delays or prevents preeclampsia. Ultrasound has limitations in the resolution of ultrasound images and fetal anomalies may be missed.  Weight loss is not advised in pregnancy and a weight gain of 11 to 20 pounds is reasonable. I explained the components of antenatal testing (biophysical profile) and that if it reassuring, the risk of stillbirth is less than 1 per 1,000 births in one week. We recommend weekly antenatal testing from [redacted] weeks gestation until delivery.    Recommendations - Levetiracetam  (Keppra ) levels at her next prenatal  visit. -  Patient to discuss the drug levels with her neurologist and seek his opinion for dosage adjustments.  Her neurologist is in [place]. - Fetal growth assessments every 4 weeks. - Weekly antenatal testing from [redacted] weeks gestation until delivery. - Delivery at [redacted] weeks gestation if cervix is favorable.     Consultation including face-to-face (more than 50%) counseling 45 minutes.

## 2024-02-29 ENCOUNTER — Other Ambulatory Visit: Payer: Self-pay

## 2024-02-29 ENCOUNTER — Ambulatory Visit (INDEPENDENT_AMBULATORY_CARE_PROVIDER_SITE_OTHER): Admitting: Family Medicine

## 2024-02-29 VITALS — BP 128/81 | HR 87 | Wt 323.8 lb

## 2024-02-29 DIAGNOSIS — Z3A21 21 weeks gestation of pregnancy: Secondary | ICD-10-CM | POA: Diagnosis not present

## 2024-02-29 DIAGNOSIS — O0990 Supervision of high risk pregnancy, unspecified, unspecified trimester: Secondary | ICD-10-CM | POA: Diagnosis not present

## 2024-02-29 DIAGNOSIS — F330 Major depressive disorder, recurrent, mild: Secondary | ICD-10-CM

## 2024-02-29 DIAGNOSIS — Z6841 Body Mass Index (BMI) 40.0 and over, adult: Secondary | ICD-10-CM | POA: Diagnosis not present

## 2024-02-29 NOTE — Progress Notes (Signed)
   PRENATAL VISIT NOTE  Subjective:  Angelica Holmes is a 23 y.o. G1P0 at [redacted]w[redacted]d being seen today for ongoing prenatal care.  She is currently monitored for the following issues for this high-risk pregnancy and has Recurrent depressive disorder, current episode mild; Anxiety disorder; Post traumatic stress disorder (PTSD); Supervision of high risk pregnancy, antepartum; BMI 50.0-59.9, adult (HCC); and Seizures (HCC) on their problem list.  Patient reports no complaints.  Contractions: Not present. Vag. Bleeding: None.  Movement: Present. Denies leaking of fluid.   The following portions of the patient's history were reviewed and updated as appropriate: allergies, current medications, past family history, past medical history, past social history, past surgical history and problem list.   Objective:    Vitals:   02/29/24 1542  BP: 128/81  Pulse: 87  Weight: (!) 323 lb 12.8 oz (146.9 kg)    Fetal Status:  Fetal Heart Rate (bpm): 142 Fundal Height: 22 cm Movement: Present    General: Alert, oriented and cooperative. Patient is in no acute distress.  Skin: Skin is warm and dry. No rash noted.   Cardiovascular: Normal heart rate noted  Respiratory: Normal respiratory effort, no problems with respiration noted  Abdomen: Soft, gravid, appropriate for gestational age.  Pain/Pressure: Absent     Pelvic: Cervical exam deferred        Extremities: Normal range of motion.  Edema: None  Mental Status: Normal mood and affect. Normal behavior. Normal judgment and thought content.   Assessment and Plan:  Pregnancy: G1P0 at [redacted]w[redacted]d 1. [redacted] weeks gestation of pregnancy  2. Supervision of high risk pregnancy, antepartum (Primary) UTD Feeling movement FH appropriate No concerns today  3. BMI 50.0-59.9, adult (HCC) TWG=1 lb 12.8 oz (0.816 kg) which is at goal Q4 week growth  4. Recurrent depressive disorder, current episode mild Stable, no concerns today  Preterm labor symptoms and general  obstetric precautions including but not limited to vaginal bleeding, contractions, leaking of fluid and fetal movement were reviewed in detail with the patient. Please refer to After Visit Summary for other counseling recommendations.   Return in about 4 weeks (around 03/28/2024) for Mom+Baby Combined Care.  Future Appointments  Date Time Provider Department Center  03/28/2024  1:35 PM Eldonna Suzen Octave, MD [facility]  04/09/2024 11:15 AM [facility] PROVIDER 1 [facility]  04/09/2024 11:30 AM [facility] US3 [facility]  04/25/2024  9:00 AM [facility] LAB [facility]  04/25/2024 10:35 AM [facility] PROVIDER [facility]  05/07/2024 11:15 AM [facility] PROVIDER 1 [facility]  05/07/2024 11:30 AM [facility] US1 [facility]  05/09/2024 11:15 AM [facility] PROVIDER [facility]  05/23/2024 11:15 AM [facility] PROVIDER [facility]  06/05/2024 11:15 AM [facility] PROVIDER [facility]  06/19/2024 11:15 AM [facility] PROVIDER [facility]  06/26/2024 11:15 AM [facility] PROVIDER [facility]  07/03/2024 11:15 AM [facility] PROVIDER [facility]  07/10/2024 11:15 AM [facility] PROVIDER [facility]  08/14/2024 11:15 AM [facility] PROVIDER [facility]    Suzen Octave Eldonna, MD

## 2024-03-03 LAB — AFP, SERUM, OPEN SPINA BIFIDA
AFP MoM: 0.76
AFP Value: 34.7 ng/mL
Gest. Age on Collection Date: 21 wk
Maternal Age At EDD: 21.8 a
OSBR Risk 1 IN: 10000
Test Results:: NEGATIVE
Weight: 324 [lb_av]

## 2024-03-18 ENCOUNTER — Other Ambulatory Visit: Payer: Self-pay

## 2024-03-18 ENCOUNTER — Encounter (HOSPITAL_COMMUNITY): Payer: Self-pay | Admitting: Family Medicine

## 2024-03-18 ENCOUNTER — Inpatient Hospital Stay (HOSPITAL_COMMUNITY)
Admission: AD | Admit: 2024-03-18 | Discharge: 2024-03-19 | Disposition: A | Discharge status: Home / Self Care | Attending: Family Medicine | Admitting: Family Medicine

## 2024-03-18 DIAGNOSIS — Z79899 Other long term (current) drug therapy: Secondary | ICD-10-CM | POA: Insufficient documentation

## 2024-03-18 DIAGNOSIS — O99352 Diseases of the nervous system complicating pregnancy, second trimester: Secondary | ICD-10-CM | POA: Insufficient documentation

## 2024-03-18 DIAGNOSIS — R251 Tremor, unspecified: Secondary | ICD-10-CM | POA: Insufficient documentation

## 2024-03-18 DIAGNOSIS — R569 Unspecified convulsions: Secondary | ICD-10-CM | POA: Insufficient documentation

## 2024-03-18 DIAGNOSIS — Z7982 Long term (current) use of aspirin: Secondary | ICD-10-CM | POA: Insufficient documentation

## 2024-03-18 DIAGNOSIS — M79604 Pain in right leg: Secondary | ICD-10-CM | POA: Insufficient documentation

## 2024-03-18 DIAGNOSIS — R42 Dizziness and giddiness: Secondary | ICD-10-CM | POA: Insufficient documentation

## 2024-03-18 DIAGNOSIS — M79605 Pain in left leg: Secondary | ICD-10-CM | POA: Insufficient documentation

## 2024-03-18 DIAGNOSIS — Z3A23 23 weeks gestation of pregnancy: Secondary | ICD-10-CM | POA: Insufficient documentation

## 2024-03-18 HISTORY — DX: Epilepsy, unspecified, not intractable, without status epilepticus: G40.909

## 2024-03-18 MED ORDER — ACETAMINOPHEN 500 MG PO TABS
1000.0000 mg | ORAL_TABLET | Freq: Once | ORAL | Status: AC
  Administered 2024-03-19: 1000 mg via ORAL
  Filled 2024-03-18: qty 2

## 2024-03-18 NOTE — MAU Provider Note (Signed)
 History     CSN: [id number]  Arrival date and time: 03/18/24 2218 First Provider Initiated Contact with Patient 03/18/2024 10:45 PM  Chief Complaint  Patient presents with   Dizziness    Leg Pain     HPI Angelica Holmes is a 23 y.o. G1P0 at [redacted]w[redacted]d, 07/11/2024, by Ultrasound, who presents to the Maternity Assessment Unit for leg pain after seizure.   Patient reports leg pain since seizure yesterday morning. Her partner reports he entered the room to find her whole body shaking and drooling, states she came to for a second said hi baby and began shaking again. He thinks the second episode lasted about 10 seconds. Patient reports another witnessed seizure today in her sleep. It stopped when her friend picked up her arm, then she began shaking again, probably lasted 30-60sec. She has been taking Keppra  500mg  BID since April and had not had any seizures until this month. She has now had 3 seizures in October. She say she got a Keppra  level drawn earlier this month after discussing the first seizure with her OB, but she does not know the result and her medication has not been increased.   She reports b/l leg pain that started after yesterday's seizure. It is described as sharp/stabbing pain in both legs, worst in the lateral thigh. Rated 7/10 now at rest, worst 10/10 when she stands.  Had numb/tingle R foot earlier but has resolved after . Walking is very painful and has gotten worse since onset. Not improved with stretch or massage, minimal relief with warm water in the shower. No h/o similar leg pains. She notes dizziness earlier that resolved after eating.   ROS (+) FM (-) back pain, abd pain, pelvic pain, VB, ctx  Medications Prior to Admission  Medication Sig Dispense Refill Last Dose/Taking   aspirin  EC 81 MG tablet Take 1 tablet (81 mg total) by mouth daily. Start taking when you are [redacted] weeks pregnant for rest of pregnancy for prevention of preeclampsia 90 tablet 3 03/18/2024    levETIRAcetam  (KEPPRA  XR) 500 MG 24 hr tablet Take 2 tablets (1,000 mg total) by mouth daily. 30 tablet 5 03/18/2024   Prenatal Vit-Fe Fumarate-FA (PRENATAL VITAMIN) 27-0.8 MG TABS Take 1 tablet by mouth daily. 30 tablet 11 03/18/2024   albuterol  (VENTOLIN  HFA) 108 (90 Base) MCG/ACT inhaler Inhale 2 puffs into the lungs every 6 (six) hours as needed for wheezing or shortness of breath.      albuterol  (VENTOLIN  HFA) 108 (90 Base) MCG/ACT inhaler Inhale 2 puffs into the lungs every 6 (six) hours as needed for wheezing 18 g 5 More than a month    Past Medical History:  Diagnosis Date   Anxiety    Depression    Epilepsy (HCC)    PTSD (post-traumatic stress disorder)     History reviewed. No pertinent surgical history.   Allergies: No Known Allergies  ROS reviewed and pertinent positives and negatives as documented in HPI.    Physical Exam  BP 122/77 (BP Location: Right Arm)   Pulse 91   Temp 98.1 F (36.7 C) (Oral)   Resp 20   Ht 5' 3 (1.6 m)   Wt (!) 145.6 kg   LMP 09/14/2023 (Exact Date)   SpO2 98%   BMI 56.84 kg/m   Gen: alert, no acute distress CV: regular rate Resp: nonlabored Abd: obese, nontender Extremities: no swelling, sensation to light tough intact over b/l LE, painful to light touch on medial and lateral thighs.  No asymmetric swelling or erythema.    FHT Baseline: 150 bpm Variability: Good {> 6 bpm) Accelerations: Reactive Decelerations: Absent Uterine activity: None  Labs O/Positive/-- (08/04 1600)   Results for orders placed or performed during the hospital encounter of 03/18/24 (from the past 24 hours)  CBC with Differential/Platelet     Status: Abnormal   Collection Time: 03/19/24 12:09 AM  Result Value Ref Range   WBC 7.8 4.0 - 10.5 K/uL   RBC 3.67 (L) 3.87 - 5.11 MIL/uL   Hemoglobin 10.8 (L) 12.0 - 15.0 g/dL   HCT 67.2 (L) 63.9 - 53.9 %   MCV 89.1 80.0 - 100.0 fL   MCH 29.4 26.0 - 34.0 pg   MCHC 33.0 30.0 - 36.0 g/dL   RDW 86.0 88.4 -  84.4 %   Platelets 310 150 - 400 K/uL   nRBC 0.0 0.0 - 0.2 %   Neutrophils Relative % 67 %   Neutro Abs 5.2 1.7 - 7.7 K/uL   Lymphocytes Relative 24 %   Lymphs Abs 1.9 0.7 - 4.0 K/uL   Monocytes Relative 7 %   Monocytes Absolute 0.5 0.1 - 1.0 K/uL   Eosinophils Relative 1 %   Eosinophils Absolute 0.0 0.0 - 0.5 K/uL   Basophils Relative 1 %   Basophils Absolute 0.0 0.0 - 0.1 K/uL   Immature Granulocytes 0 %   Abs Immature Granulocytes 0.03 0.00 - 0.07 K/uL  Comprehensive metabolic panel with GFR     Status: Abnormal   Collection Time: 03/19/24 12:09 AM  Result Value Ref Range   Sodium 135 135 - 145 mmol/L   Potassium 3.8 3.5 - 5.1 mmol/L   Chloride 104 98 - 111 mmol/L   CO2 22 22 - 32 mmol/L   Glucose, Bld 94 70 - 99 mg/dL   BUN 5 (L) 6 - 20 mg/dL   Creatinine, Ser 9.40 0.44 - 1.00 mg/dL   Calcium 8.6 (L) 8.9 - 10.3 mg/dL   Total Protein 6.5 6.5 - 8.1 g/dL   Albumin 2.7 (L) 3.5 - 5.0 g/dL   AST 16 15 - 41 U/L   ALT 19 0 - 44 U/L   Alkaline Phosphatase 98 38 - 126 U/L   Total Bilirubin 0.6 0.0 - 1.2 mg/dL   GFR, Estimated >39 >39 mL/min   Anion gap 9 5 - 15  Magnesium     Status: None   Collection Time: 03/19/24 12:09 AM  Result Value Ref Range   Magnesium 1.9 1.7 - 2.4 mg/dL      Assessment and Plan  MDM ERYCA BOLTE is a 23 y.o. G1P0 at [redacted]w[redacted]d, 07/11/2024, by Ultrasound, who presents to the MAU for leg pain. Ddx: round ligament pain, sciatica, iron deficiency, B12 deficiency, other elyte abnormality, subtherapeutic Keppra  level, Todd paralysis.    Electrolytes unremarkable. Keppra  level is send-out lab. Pt feeling improved after 1g acetaminophen, able to move in the bed unassisted and with less pain. Feels comfortable to return home, rx acetaminophen sent.      ICD-10-CM   1. [redacted] weeks gestation of pregnancy  Z3A.23     2. Leg pain, bilateral  M79.604    M79.605     3. Convulsions, unspecified convulsion type (HCC)  R56.9        Results pending at the time  of DC: Keppra  level Dispo: DC home in stable condition with return precautions discussed and included in AVS.    Barabara Maier, DO FMOB Fellow, Faculty Practice [facility], Center for [employer]

## 2024-03-18 NOTE — MAU Note (Signed)
 Angelica Holmes is a 23 y.o. at [redacted]w[redacted]d here in MAU reporting: bilateral thigh pain that is worse in the right thigh, and tingling in both of her calf that started after having a seizure Saturday morning around 1130. Seizure was in her bed and denies any leg injury or falls. Patient reports walking increases her pain. She also reports having dizziness that has been intermittent for the past few days. She has not tried taking anything for the pain, but has tried warm soaks and elevation with some relief. Patient denies having any shortness of breath, leg swelling, warmth, or redness. +FM. Denies LOF or VB.  Onset of complaint: Yesterday morning Pain score: 8/10 Vitals:   03/18/24 2243  BP: 134/84  Pulse: 93  Resp: 20  Temp: 98.1 F (36.7 C)  SpO2: 98%     FHT: 155  Lab orders placed from triage: urine

## 2024-03-19 ENCOUNTER — Other Ambulatory Visit: Payer: Self-pay

## 2024-03-19 ENCOUNTER — Other Ambulatory Visit (HOSPITAL_COMMUNITY): Payer: Self-pay

## 2024-03-19 DIAGNOSIS — Z3A23 23 weeks gestation of pregnancy: Secondary | ICD-10-CM | POA: Diagnosis not present

## 2024-03-19 DIAGNOSIS — M79604 Pain in right leg: Secondary | ICD-10-CM

## 2024-03-19 DIAGNOSIS — R42 Dizziness and giddiness: Secondary | ICD-10-CM | POA: Diagnosis present

## 2024-03-19 DIAGNOSIS — Z7982 Long term (current) use of aspirin: Secondary | ICD-10-CM | POA: Diagnosis not present

## 2024-03-19 DIAGNOSIS — O26892 Other specified pregnancy related conditions, second trimester: Secondary | ICD-10-CM | POA: Diagnosis not present

## 2024-03-19 DIAGNOSIS — R251 Tremor, unspecified: Secondary | ICD-10-CM | POA: Diagnosis not present

## 2024-03-19 DIAGNOSIS — Z79899 Other long term (current) drug therapy: Secondary | ICD-10-CM | POA: Diagnosis not present

## 2024-03-19 DIAGNOSIS — O99352 Diseases of the nervous system complicating pregnancy, second trimester: Secondary | ICD-10-CM | POA: Diagnosis not present

## 2024-03-19 DIAGNOSIS — R569 Unspecified convulsions: Secondary | ICD-10-CM | POA: Diagnosis not present

## 2024-03-19 DIAGNOSIS — M79605 Pain in left leg: Secondary | ICD-10-CM | POA: Diagnosis not present

## 2024-03-19 LAB — COMPREHENSIVE METABOLIC PANEL WITH GFR
ALT: 19 U/L (ref 0–44)
AST: 16 U/L (ref 15–41)
Albumin: 2.7 g/dL — ABNORMAL LOW (ref 3.5–5.0)
Alkaline Phosphatase: 98 U/L (ref 38–126)
Anion gap: 9 (ref 5–15)
BUN: 5 mg/dL — ABNORMAL LOW (ref 6–20)
CO2: 22 mmol/L (ref 22–32)
Calcium: 8.6 mg/dL — ABNORMAL LOW (ref 8.9–10.3)
Chloride: 104 mmol/L (ref 98–111)
Creatinine, Ser: 0.59 mg/dL (ref 0.44–1.00)
GFR, Estimated: >60 mL/min (ref >60)
Glucose, Bld: 94 mg/dL (ref 70–99)
Potassium: 3.8 mmol/L (ref 3.5–5.1)
Sodium: 135 mmol/L (ref 135–145)
Total Bilirubin: 0.6 mg/dL (ref 0.0–1.2)
Total Protein: 6.5 g/dL (ref 6.5–8.1)

## 2024-03-19 LAB — CBC WITH DIFFERENTIAL/PLATELET
Abs Immature Granulocytes: 0.03 K/uL (ref 0.00–0.07)
Basophils Absolute: 0 K/uL (ref 0.0–0.1)
Basophils Relative: 1 %
Eosinophils Absolute: 0 K/uL (ref 0.0–0.5)
Eosinophils Relative: 1 %
HCT: 32.7 % — ABNORMAL LOW (ref 36.0–46.0)
Hemoglobin: 10.8 g/dL — ABNORMAL LOW (ref 12.0–15.0)
Immature Granulocytes: 0 %
Lymphocytes Relative: 24 %
Lymphs Abs: 1.9 K/uL (ref 0.7–4.0)
MCH: 29.4 pg (ref 26.0–34.0)
MCHC: 33 g/dL (ref 30.0–36.0)
MCV: 89.1 fL (ref 80.0–100.0)
Monocytes Absolute: 0.5 K/uL (ref 0.1–1.0)
Monocytes Relative: 7 %
Neutro Abs: 5.2 K/uL (ref 1.7–7.7)
Neutrophils Relative %: 67 %
Platelets: 310 K/uL (ref 150–400)
RBC: 3.67 MIL/uL — ABNORMAL LOW (ref 3.87–5.11)
RDW: 13.9 % (ref 11.5–15.5)
WBC: 7.8 K/uL (ref 4.0–10.5)
nRBC: 0 % (ref 0.0–0.2)

## 2024-03-19 LAB — MAGNESIUM: Magnesium: 1.9 mg/dL (ref 1.7–2.4)

## 2024-03-19 MED ORDER — ACETAMINOPHEN 500 MG PO TABS: 1000.0000 mg | ORAL_TABLET | Freq: Four times a day (QID) | ORAL | 0 refills | Status: DC | PRN

## 2024-03-19 NOTE — MAU Provider Note (Incomplete)
 History     CSN: [id number]  Arrival date and time: 03/18/24 2218 First Provider Initiated Contact with Patient 03/18/2024 10:45 PM  Chief Complaint  Patient presents with  . Dizziness    Leg Pain     HPI Angelica Holmes is a 23 y.o. G1P0 at [redacted]w[redacted]d, 07/11/2024, by Ultrasound, who presents to the Maternity Assessment Unit for leg pain after seizure.   Patient reports leg pain since seizure yesterday morning. Her partner reports he entered the room to find her whole body shaking and drooling, states she came to for a second said hi baby and began shaking again. He thinks the second episode lasted about 10 seconds. Patient reports another witnessed seizure today in her sleep. It stopped when her friend picked up her arm, then she began shaking again, probably lasted 30-60sec. She has been taking Keppra  500mg  BID since April and had not had any seizures until this month. She has now had 3 seizures in October. She say she got a Keppra  level drawn earlier this month after discussing the first seizure with her OB, but she does not know the result and her medication has not been increased.   She reports b/l leg pain that started after yesterday's seizure. It is described as sharp/stabbing pain in both legs, worst in the lateral thigh. Rated 7/10 now at rest, worst 10/10 when she stands.  Had numb/tingle R foot earlier but has resolved after . Walking is very painful and has gotten worse since onset. Not improved with stretch or massage, minimal relief with warm water in the shower. No h/o similar leg pains.  ROS (+) FM (-) back pain, abd pain, pelvic pain, VB, ctx  Medications Prior to Admission  Medication Sig Dispense Refill Last Dose/Taking  . aspirin  EC 81 MG tablet Take 1 tablet (81 mg total) by mouth daily. Start taking when you are [redacted] weeks pregnant for rest of pregnancy for prevention of preeclampsia 90 tablet 3 03/18/2024  . levETIRAcetam  (KEPPRA  XR) 500 MG 24 hr tablet Take 2  tablets (1,000 mg total) by mouth daily. 30 tablet 5 03/18/2024  . Prenatal Vit-Fe Fumarate-FA (PRENATAL VITAMIN) 27-0.8 MG TABS Take 1 tablet by mouth daily. 30 tablet 11 03/18/2024  . albuterol  (VENTOLIN  HFA) 108 (90 Base) MCG/ACT inhaler Inhale 2 puffs into the lungs every 6 (six) hours as needed for wheezing or shortness of breath.     . albuterol  (VENTOLIN  HFA) 108 (90 Base) MCG/ACT inhaler Inhale 2 puffs into the lungs every 6 (six) hours as needed for wheezing 18 g 5 More than a month    Past Medical History:  Diagnosis Date  . Anxiety   . Depression   . Epilepsy (HCC)   . PTSD (post-traumatic stress disorder)     History reviewed. No pertinent surgical history.   Allergies: No Known Allergies  ROS reviewed and pertinent positives and negatives as documented in HPI.    Physical Exam  BP 122/77 (BP Location: Right Arm)   Pulse 91   Temp 98.1 F (36.7 C) (Oral)   Resp 20   Ht 5' 3 (1.6 m)   Wt (!) 145.6 kg   LMP 09/14/2023 (Exact Date)   SpO2 98%   BMI 56.84 kg/m   Gen: alert, no acute distress CV: regular rate Resp: nonlabored Abd: obese, nontender*** Extremities: no swelling, sensation to light tough intact over b/l LE, painful to light touch on medial and lateral thighs. No asymmetric swelling or erythema.    FHT  Baseline: *** bpm Variability: {:31519} Accelerations: {:31520} Decelerations: {FHR DECEL PRESENT:31526} Uterine activity: {Uterine contractions:31516}  Bedside Ultrasound {Bedside US :29431} My interpretation: {MAU Bedside US  Findings:29432}  Labs O/Positive/-- (08/04 1600)   No results found for this or any previous visit (from the past 24 hours).  Imaging US  OB Comp Less 14 Wks with OB Transvaginal ***   Assessment and Plan  MDM Angelica Holmes is a 23 y.o. G1P0 at [redacted]w[redacted]d, 07/11/2024, by Ultrasound, who presents to the MAU for ***.  Ddx:  {FJLIIK:67536} ***.   Pt feeling *** improved after ***   No diagnosis found.   Results  pending at the time of DC: *** Dispo: DC home in stable condition with return precautions discussed and included in AVS.    Angelica Maier, DO FMOB Fellow, Faculty Practice [facility], Center for [facility]

## 2024-03-19 NOTE — Discharge Instructions (Signed)
-   safe to take acetaminophen (Tylenol) during pregnancy. Up to 1000mg  every 8 hours as needed

## 2024-03-20 LAB — LEVETIRACETAM LEVEL: Levetiracetam Lvl: <2 ug/mL — ABNORMAL LOW (ref 10.0–40.0)

## 2024-03-23 ENCOUNTER — Ambulatory Visit: Payer: Self-pay

## 2024-03-23 DIAGNOSIS — R569 Unspecified convulsions: Secondary | ICD-10-CM

## 2024-03-28 ENCOUNTER — Ambulatory Visit: Admitting: Family Medicine

## 2024-03-28 ENCOUNTER — Other Ambulatory Visit: Payer: Self-pay

## 2024-03-28 ENCOUNTER — Other Ambulatory Visit (HOSPITAL_COMMUNITY): Payer: Self-pay

## 2024-03-28 VITALS — BP 130/85 | HR 97 | Wt 322.4 lb

## 2024-03-28 DIAGNOSIS — Z6841 Body Mass Index (BMI) 40.0 and over, adult: Secondary | ICD-10-CM

## 2024-03-28 DIAGNOSIS — F330 Major depressive disorder, recurrent, mild: Secondary | ICD-10-CM

## 2024-03-28 DIAGNOSIS — R569 Unspecified convulsions: Secondary | ICD-10-CM

## 2024-03-28 DIAGNOSIS — O0990 Supervision of high risk pregnancy, unspecified, unspecified trimester: Secondary | ICD-10-CM

## 2024-03-28 MED ORDER — LEVETIRACETAM ER 500 MG PO TB24
1000.0000 mg | ORAL_TABLET | Freq: Two times a day (BID) | ORAL | 5 refills | Status: DC
  Filled 2024-03-28 – 2024-05-24 (×2): qty 30, 8d supply, fill #0

## 2024-03-28 NOTE — Progress Notes (Signed)
 PRENATAL VISIT NOTE  Subjective:  Angelica Holmes is a 23 y.o. G1P0 at [redacted]w[redacted]d being seen today for ongoing prenatal care.  She is currently monitored for the following issues for this high-risk pregnancy and has Recurrent depressive disorder, current episode mild; Anxiety disorder; Post traumatic stress disorder (PTSD); Supervision of high risk pregnancy, antepartum; BMI 50.0-59.9, adult (HCC); and Seizures (HCC) on their problem list.  Patient reports no complaints.  Contractions: Not present. Vag. Bleeding: None.  Movement: Present. Denies leaking of fluid.   The following portions of the patient's history were reviewed and updated as appropriate: allergies, current medications, past family history, past medical history, past social history, past surgical history and problem list.   Objective:   Vitals:   03/28/24 1355  BP: 130/85  Pulse: 97  Weight: (!) 322 lb 6.4 oz (146.2 kg)    Fetal Status:  Fetal Heart Rate (bpm): 156   Movement: Present    General: Alert, oriented and cooperative. Patient is in no acute distress.  Skin: Skin is warm and dry. No rash noted.   Cardiovascular: Normal heart rate noted  Respiratory: Normal respiratory effort, no problems with respiration noted  Abdomen: Soft, gravid, appropriate for gestational age.  Pain/Pressure: Absent     Pelvic: Cervical exam deferred        Extremities: Normal range of motion.  Edema: None  Mental Status: Normal mood and affect. Normal behavior. Normal judgment and thought content.      12/26/2023    3:31 PM 09/28/2022   11:35 AM 06/23/2022    2:10 PM  Depression screen PHQ 2/9  Decreased Interest 0 2 1  Down, Depressed, Hopeless 0 2 1  PHQ - 2 Score 0 4 2  Altered sleeping 3 2 1   Tired, decreased energy 3 2 1   Change in appetite 1 1 0  Feeling bad or failure about yourself  0 1 1  Trouble concentrating 0 1 0  Moving slowly or fidgety/restless 0 2 0  Suicidal thoughts 0 0 0  PHQ-9 Score 7 13 5          12/26/2023    3:31 PM 09/28/2022   11:40 AM 06/23/2022    2:10 PM  GAD 7 : Generalized Anxiety Score  Nervous, Anxious, on Edge 1 3 3   Control/stop worrying 1 2 3   Worry too much - different things 0 3 3  Trouble relaxing 1 3 3   Restless 0 2 3  Easily annoyed or irritable 0 2 3  Afraid - awful might happen 0 2 2  Total GAD 7 Score 3 17 20     Assessment and Plan:  Pregnancy: G1P0 at [redacted]w[redacted]d 1. Supervision of high risk pregnancy, antepartum (Primary) Up to date FH appropriate  Vigorous movement Question about handicap placard, encouraged to send in form  2. Seizures (HCC) Seen on 10/28 for seizures Was taking Keppra  500 BID --> was increased to 1000mg  BID  3. Recurrent depressive disorder, current episode mild Mood appears stable, last PHQ9 and GAD 7 were improved overall  4. BMI 50.0-59.9, adult (HCC)   Preterm labor symptoms and general obstetric precautions including but not limited to vaginal bleeding, contractions, leaking of fluid and fetal movement were reviewed in detail with the patient. Please refer to After Visit Summary for other counseling recommendations.   No follow-ups on file.  Future Appointments  Date Time Provider Department Center  04/09/2024 11:15 AM WMC-MFC PROVIDER 1 [facility]  04/09/2024 11:30 AM WMC-MFC US3 WMC-MFCUS  [facility]  04/25/2024  9:00 AM WMC-WOCA LAB [facility]  04/25/2024 10:35 AM Ilean Norleen GAILS, MD [facility]  05/07/2024 11:15 AM WMC-MFC PROVIDER 1 [facility]  05/07/2024 11:30 AM WMC-MFC US1 [facility]  05/09/2024 11:15 AM Ilean Norleen GAILS, MD [facility]  05/23/2024 11:15 AM Ilean Norleen GAILS, MD [facility]  06/05/2024 11:15 AM WMC-CWH MOM BABY DYAD PROVIDER WMC-MBD [facility]  06/19/2024 11:15 AM WMC-CWH MOM BABY DYAD PROVIDER [facility]  06/26/2024 11:15 AM WMC-CWH MOM BABY DYAD PROVIDER WMC-MBD [facility]  07/03/2024 11:15 AM WMC-CWH MOM BABY DYAD PROVIDER WMC-MBD [facility]  07/10/2024 11:15 AM WMC-CWH MOM BABY DYAD PROVIDER WMC-MBD [facility]  08/14/2024 11:15  AM WMC-CWH MOM BABY DYAD PROVIDER WMC-MBD [facility]    Suzen Maryan Masters, MD

## 2024-04-09 ENCOUNTER — Ambulatory Visit: Attending: Obstetrics and Gynecology | Admitting: Obstetrics and Gynecology

## 2024-04-09 ENCOUNTER — Other Ambulatory Visit (HOSPITAL_COMMUNITY): Payer: Self-pay

## 2024-04-09 ENCOUNTER — Ambulatory Visit

## 2024-04-09 DIAGNOSIS — G40909 Epilepsy, unspecified, not intractable, without status epilepticus: Secondary | ICD-10-CM | POA: Diagnosis not present

## 2024-04-09 DIAGNOSIS — Z79899 Other long term (current) drug therapy: Secondary | ICD-10-CM | POA: Insufficient documentation

## 2024-04-09 DIAGNOSIS — Z364 Encounter for antenatal screening for fetal growth retardation: Secondary | ICD-10-CM | POA: Diagnosis not present

## 2024-04-09 DIAGNOSIS — O99212 Obesity complicating pregnancy, second trimester: Secondary | ICD-10-CM

## 2024-04-09 DIAGNOSIS — O358XX0 Maternal care for other (suspected) fetal abnormality and damage, not applicable or unspecified: Secondary | ICD-10-CM | POA: Diagnosis present

## 2024-04-09 DIAGNOSIS — E669 Obesity, unspecified: Secondary | ICD-10-CM | POA: Diagnosis not present

## 2024-04-09 DIAGNOSIS — E6689 Other obesity not elsewhere classified: Secondary | ICD-10-CM

## 2024-04-09 DIAGNOSIS — Z3A26 26 weeks gestation of pregnancy: Secondary | ICD-10-CM

## 2024-04-09 DIAGNOSIS — O99352 Diseases of the nervous system complicating pregnancy, second trimester: Secondary | ICD-10-CM

## 2024-04-09 NOTE — Progress Notes (Signed)
 Maternal-Fetal Medicine Consultation  Name: Angelica Holmes  MRN: [id number]  GA: G1P0 [redacted]w[redacted]d   Patient is here for fetal growth assessment. Seizure disorder.  Patient takes Keppra .  Recent Keppra  level was very low and patient had seizure about 2 weeks ago.  Since then, the dosage of Keppra  was increased to 1000 mg twice daily.  Patient reports no further seizures after increasing dosage.  Ultrasound Normal fetal growth and amniotic fluid.  I discussed the importance of taking antiseizure medications regularly.  Discussed fall precautions.  I counseled the patient that Keppra  doses may have to be increased further if she has breakthrough seizures.  Recommendations -Fetal growth assessment in 4 weeks. - Levetiracetam  (Keppra ) levels in 2 weeks.     Consultation including face-to-face (more than 50%) counseling 20 minutes.

## 2024-04-25 ENCOUNTER — Ambulatory Visit (INDEPENDENT_AMBULATORY_CARE_PROVIDER_SITE_OTHER): Admitting: Family Medicine

## 2024-04-25 ENCOUNTER — Other Ambulatory Visit: Payer: Self-pay

## 2024-04-25 ENCOUNTER — Other Ambulatory Visit

## 2024-04-25 VITALS — BP 135/85 | HR 109 | Wt 330.1 lb

## 2024-04-25 DIAGNOSIS — Z23 Encounter for immunization: Secondary | ICD-10-CM

## 2024-04-25 DIAGNOSIS — Z3A29 29 weeks gestation of pregnancy: Secondary | ICD-10-CM

## 2024-04-25 DIAGNOSIS — Z6841 Body Mass Index (BMI) 40.0 and over, adult: Secondary | ICD-10-CM

## 2024-04-25 DIAGNOSIS — F330 Major depressive disorder, recurrent, mild: Secondary | ICD-10-CM | POA: Diagnosis not present

## 2024-04-25 DIAGNOSIS — R569 Unspecified convulsions: Secondary | ICD-10-CM | POA: Diagnosis not present

## 2024-04-25 DIAGNOSIS — O0990 Supervision of high risk pregnancy, unspecified, unspecified trimester: Secondary | ICD-10-CM

## 2024-04-25 NOTE — Progress Notes (Signed)
 PRENATAL VISIT NOTE  Subjective:  Angelica Holmes is a 23 y.o. G1P0 at [redacted]w[redacted]d being seen today for ongoing prenatal care.  She is currently monitored for the following issues for this low-risk pregnancy and has Recurrent depressive disorder, current episode mild; Anxiety disorder; Post traumatic stress disorder (PTSD); Supervision of high risk pregnancy, antepartum; BMI 50.0-59.9, adult (HCC); and Seizures (HCC) on their problem list.  Patient reports no bleeding, no contractions, no cramping, and no leaking.  Contractions: Not present. Vag. Bleeding: None.  Movement: Present. Denies leaking of fluid.   The following portions of the patient's history were reviewed and updated as appropriate: allergies, current medications, past family history, past medical history, past social history, past surgical history and problem list.   Objective:   Vitals:   04/25/24 1017  BP: 135/85  Pulse: (!) 109  Weight: (!) 330 lb 1.6 oz (149.7 kg)    Fetal Status:  Fetal Heart Rate (bpm): 147   Movement: Present    General: Alert, oriented and cooperative. Patient is in no acute distress.  Skin: Skin is warm and dry. No rash noted.   Cardiovascular: Normal heart rate noted  Respiratory: Normal respiratory effort, no problems with respiration noted  Abdomen: Soft, gravid, appropriate for gestational age.  Pain/Pressure: Absent     Pelvic: Cervical exam deferred        Extremities: Normal range of motion.  Edema: None  Mental Status: Normal mood and affect. Normal behavior. Normal judgment and thought content.      12/26/2023    3:31 PM 09/28/2022   11:35 AM 06/23/2022    2:10 PM  Depression screen PHQ 2/9  Decreased Interest 0 2 1  Down, Depressed, Hopeless 0 2 1  PHQ - 2 Score 0 4 2  Altered sleeping 3 2 1   Tired, decreased energy 3 2 1   Change in appetite 1 1 0  Feeling bad or failure about yourself  0 1 1  Trouble concentrating 0 1 0  Moving slowly or fidgety/restless 0 2 0  Suicidal  thoughts 0 0 0  PHQ-9 Score 7  13  5       Data saved with a previous flowsheet row definition        12/26/2023    3:31 PM 09/28/2022   11:40 AM 06/23/2022    2:10 PM  GAD 7 : Generalized Anxiety Score  Nervous, Anxious, on Edge 1 3 3   Control/stop worrying 1 2 3   Worry too much - different things 0 3 3  Trouble relaxing 1 3 3   Restless 0 2 3  Easily annoyed or irritable 0 2 3  Afraid - awful might happen 0 2 2  Total GAD 7 Score 3 17 20     Assessment and Plan:  Pregnancy: G1P0 at [redacted]w[redacted]d 1. Supervision of high risk pregnancy, antepartum (Primary) FHR BP appropriate today - Flu vaccine trivalent PF, 6mos and older(Flulaval,Afluria,Fluarix,Fluzone) - Tdap vaccine greater than or equal to 7yo IM  2. Seizures (HCC) Currently on Keppra  1000 mg twice daily No recent seizures  3. Recurrent depressive disorder, current episode mild Doing well Anxiety is well-controlled  4. BMI 50.0-59.9, adult (HCC) On ASA  5. [redacted] weeks gestation of pregnancy Completing 28-week labs today - Flu vaccine trivalent PF, 6mos and older(Flulaval,Afluria,Fluarix,Fluzone) - Tdap vaccine greater than or equal to 7yo IM  Preterm labor symptoms and general obstetric precautions including but not limited to vaginal bleeding, contractions, leaking of fluid and fetal movement were reviewed in  detail with the patient. Please refer to After Visit Summary for other counseling recommendations.   No follow-ups on file.  Future Appointments  Date Time Provider Department Center  05/07/2024 11:15 AM WMC-MFC PROVIDER 1 [facility]  05/07/2024 11:30 AM WMC-MFC US1 [facility]  05/09/2024 11:15 AM Ilean Norleen GAILS, MD [facility]  05/23/2024 11:15 AM Ilean Norleen GAILS, MD [facility]  06/05/2024 11:15 AM Lola Donnice HERO, MD [facility]  06/19/2024 11:15 AM Lola Donnice HERO, MD [facility]  06/26/2024 11:15 AM Lola Donnice HERO, MD [facility]  07/03/2024 11:15 AM WMC-CWH MOM BABY DYAD PROVIDER [facility]   07/10/2024 11:15 AM WMC-CWH MOM BABY DYAD PROVIDER WMC-MBD [facility]  08/14/2024 11:15 AM WMC-CWH MOM BABY DYAD PROVIDER WMC-MBD [facility]    Norleen GAILS Ilean, MD

## 2024-04-26 ENCOUNTER — Other Ambulatory Visit: Payer: Self-pay

## 2024-04-26 ENCOUNTER — Ambulatory Visit: Payer: Self-pay | Admitting: Family Medicine

## 2024-04-26 DIAGNOSIS — O0990 Supervision of high risk pregnancy, unspecified, unspecified trimester: Secondary | ICD-10-CM

## 2024-04-26 LAB — CBC
Hematocrit: 31.5 % — ABNORMAL LOW (ref 34.0–46.6)
Hemoglobin: 10.4 g/dL — ABNORMAL LOW (ref 11.1–15.9)
MCH: 29.5 pg (ref 26.6–33.0)
MCHC: 33 g/dL (ref 31.5–35.7)
MCV: 90 fL (ref 79–97)
Platelets: 302 x10E3/uL (ref 150–450)
RBC: 3.52 x10E6/uL — ABNORMAL LOW (ref 3.77–5.28)
RDW: 13.4 % (ref 11.7–15.4)
WBC: 7.1 x10E3/uL (ref 3.4–10.8)

## 2024-04-26 LAB — GLUCOSE TOLERANCE, 2 HOURS W/ 1HR
Glucose, 1 hour: 178 mg/dL (ref 70–179)
Glucose, 2 hour: 134 mg/dL (ref 70–152)
Glucose, Fasting: 101 mg/dL — ABNORMAL HIGH (ref 70–91)

## 2024-04-26 LAB — HIV ANTIBODY (ROUTINE TESTING W REFLEX): HIV Screen 4th Generation wRfx: NONREACTIVE

## 2024-04-26 LAB — SYPHILIS: RPR W/REFLEX TO RPR TITER AND TREPONEMAL ANTIBODIES, TRADITIONAL SCREENING AND DIAGNOSIS ALGORITHM: RPR Ser Ql: NONREACTIVE

## 2024-05-02 ENCOUNTER — Other Ambulatory Visit

## 2024-05-02 DIAGNOSIS — O0990 Supervision of high risk pregnancy, unspecified, unspecified trimester: Secondary | ICD-10-CM

## 2024-05-03 LAB — GLUCOSE TOLERANCE, 2 HOURS W/ 1HR
Glucose, 1 hour: 193 mg/dL — ABNORMAL HIGH (ref 70–179)
Glucose, 2 hour: 138 mg/dL (ref 70–152)
Glucose, Fasting: 101 mg/dL — ABNORMAL HIGH (ref 70–91)

## 2024-05-07 ENCOUNTER — Ambulatory Visit: Admitting: Maternal & Fetal Medicine

## 2024-05-07 ENCOUNTER — Ambulatory Visit

## 2024-05-07 ENCOUNTER — Ambulatory Visit: Payer: Self-pay

## 2024-05-07 VITALS — BP 147/78 | HR 87

## 2024-05-07 DIAGNOSIS — Z3A30 30 weeks gestation of pregnancy: Secondary | ICD-10-CM | POA: Diagnosis not present

## 2024-05-07 DIAGNOSIS — R030 Elevated blood-pressure reading, without diagnosis of hypertension: Secondary | ICD-10-CM | POA: Diagnosis not present

## 2024-05-07 DIAGNOSIS — O99353 Diseases of the nervous system complicating pregnancy, third trimester: Secondary | ICD-10-CM | POA: Diagnosis not present

## 2024-05-07 DIAGNOSIS — O163 Unspecified maternal hypertension, third trimester: Secondary | ICD-10-CM | POA: Diagnosis not present

## 2024-05-07 DIAGNOSIS — O139 Gestational [pregnancy-induced] hypertension without significant proteinuria, unspecified trimester: Secondary | ICD-10-CM | POA: Insufficient documentation

## 2024-05-07 DIAGNOSIS — O358XX0 Maternal care for other (suspected) fetal abnormality and damage, not applicable or unspecified: Secondary | ICD-10-CM | POA: Diagnosis present

## 2024-05-07 DIAGNOSIS — O99213 Obesity complicating pregnancy, third trimester: Secondary | ICD-10-CM | POA: Diagnosis not present

## 2024-05-07 DIAGNOSIS — G40909 Epilepsy, unspecified, not intractable, without status epilepticus: Secondary | ICD-10-CM | POA: Diagnosis not present

## 2024-05-07 DIAGNOSIS — Z3689 Encounter for other specified antenatal screening: Secondary | ICD-10-CM | POA: Diagnosis not present

## 2024-05-07 DIAGNOSIS — O26893 Other specified pregnancy related conditions, third trimester: Secondary | ICD-10-CM | POA: Diagnosis not present

## 2024-05-07 DIAGNOSIS — E669 Obesity, unspecified: Secondary | ICD-10-CM | POA: Diagnosis not present

## 2024-05-07 NOTE — Progress Notes (Signed)
° °  Patient information  Patient Name: Angelica Holmes  Patient MRN:   [id number]  Referring practice: MFM Referring Provider: [facility])  Problem List   Patient Active Problem List   Diagnosis Date Noted   BMI 50.0-59.9, adult (HCC) 12/27/2023   Seizures (HCC) 12/27/2023   Supervision of high risk pregnancy, antepartum 11/29/2023   Recurrent depressive disorder, current episode mild 04/29/2019   Anxiety disorder 03/13/2019   Post traumatic stress disorder (PTSD) 03/13/2019    Maternal Fetal medicine Consult  FLEETA KUNDE is a 23 y.o. G1P0 at [redacted]w[redacted]d here for ultrasound and consultation. Gauri M Brickley is doing well today with no acute concerns. Today we focused on the following:   This pregnancy is complicated by maternal obesity and an elevated blood pressure reading today without symptoms of preeclampsia. The patient denies headache, visual changes, right upper quadrant or epigastric pain, shortness of breath, or other concerning symptoms, and there is no current clinical evidence of preeclampsia. Given the isolated blood pressure elevation, close follow-up is appropriate to evaluate for evolving hypertensive disease while continuing routine antenatal surveillance. She should have labs done at her OB office in two days.   Recommendations: -Review preeclampsia precautions and instruct patient to present for headache, visual changes, abdominal pain, shortness of breath, or worsening blood pressure -Obtain preeclampsia labs in two days at the [facility] office as scheduled -Plan fetal growth US  and BPP in approximately 4 weeks -Continue routine prenatal care with blood pressure monitoring  I spent 30 minutes reviewing the patients chart, including labs and images as well as counseling the patient about her medical conditions. Greater than 50% of the time was spent in direct face-to-face patient counseling.  Delora Smaller  MFM, [place]   05/07/2024  12:48  PM   Review of Systems: A review of systems was performed and was negative except per HPI   Vitals and Physical Exam    05/07/2024   11:18 AM 04/25/2024   10:17 AM 04/09/2024   11:23 AM  Vitals with BMI  Weight  330 lbs 2 oz   Systolic 147 135 870  Diastolic 78 85 84  Pulse 87 109 101    Sitting comfortably on the sonogram table Nonlabored breathing Normal rate and rhythm Abdomen is nontender  Past pregnancies OB History  Gravida Para Term Preterm AB Living  1       SAB IAB Ectopic Multiple Live Births          # Outcome Date GA Lbr Len/2nd Weight Sex Type Anes PTL Lv  1 Current              Future Appointments  Date Time Provider Department Center  05/09/2024 11:15 AM Ilean Norleen GAILS, MD [facility]  05/23/2024 11:15 AM Ilean Norleen GAILS, MD [facility]  06/05/2024 11:15 AM [name], MD [facility]  06/06/2024 11:15 AM WMC-MFC PROVIDER 1 [facility]  06/06/2024 11:30 AM WMC-MFC US1 [facility]MFC[facility]  06/19/2024 11:15 AM [name], MD [facility]  06/26/2024 11:15 AM [name], MD [facility]  07/03/2024 11:15 AM WMC-CWH MOM BABY DYAD PROVIDER WMC-MBD [facility]  07/10/2024 11:15 AM WMC-CWH MOM BABY DYAD PROVIDER [facility]MBD [facility]  08/14/2024 11:15 AM WMC-CWH MOM BABY DYAD PROVIDER WMC-MBD [facility]

## 2024-05-09 ENCOUNTER — Other Ambulatory Visit (HOSPITAL_COMMUNITY): Payer: Self-pay

## 2024-05-09 ENCOUNTER — Ambulatory Visit: Admitting: Family Medicine

## 2024-05-09 ENCOUNTER — Telehealth: Payer: Self-pay | Admitting: *Deleted

## 2024-05-09 ENCOUNTER — Encounter: Payer: Self-pay | Admitting: Family Medicine

## 2024-05-09 ENCOUNTER — Other Ambulatory Visit: Payer: Self-pay

## 2024-05-09 VITALS — BP 128/81 | HR 87 | Wt 327.2 lb

## 2024-05-09 DIAGNOSIS — O163 Unspecified maternal hypertension, third trimester: Secondary | ICD-10-CM | POA: Diagnosis not present

## 2024-05-09 DIAGNOSIS — R569 Unspecified convulsions: Secondary | ICD-10-CM

## 2024-05-09 DIAGNOSIS — O0990 Supervision of high risk pregnancy, unspecified, unspecified trimester: Secondary | ICD-10-CM

## 2024-05-09 DIAGNOSIS — O24410 Gestational diabetes mellitus in pregnancy, diet controlled: Secondary | ICD-10-CM | POA: Diagnosis not present

## 2024-05-09 DIAGNOSIS — Z6841 Body Mass Index (BMI) 40.0 and over, adult: Secondary | ICD-10-CM

## 2024-05-09 DIAGNOSIS — Z3A31 31 weeks gestation of pregnancy: Secondary | ICD-10-CM

## 2024-05-09 DIAGNOSIS — F330 Major depressive disorder, recurrent, mild: Secondary | ICD-10-CM | POA: Diagnosis not present

## 2024-05-09 MED ORDER — ACCU-CHEK GUIDE TEST VI STRP
ORAL_STRIP | 12 refills | Status: DC
  Filled 2024-05-09: qty 100, 25d supply, fill #0

## 2024-05-09 MED ORDER — ACCU-CHEK SOFTCLIX LANCETS MISC
12 refills | Status: DC
  Filled 2024-05-09: qty 100, 25d supply, fill #0

## 2024-05-09 MED ORDER — ACCU-CHEK GUIDE W/DEVICE KIT
1.0000 | PACK | Freq: Four times a day (QID) | 0 refills | Status: DC
  Filled 2024-05-09: qty 1, 30d supply, fill #0

## 2024-05-09 NOTE — Telephone Encounter (Signed)
 Contacted pharmacy and informed patient of coupon from [employer] in order to receive device at no cost to her. Patient states that she is able to pick up testing supplies from pharmacy.

## 2024-05-09 NOTE — Telephone Encounter (Signed)
 Call received from Chantel @ [employer] who stated that pt's glucose monitor is not covered by Medicaid. She requested prior auth to be faxed to [phone] or alternate glucose monitor to be prescribed.

## 2024-05-10 LAB — CBC
Hematocrit: 31.1 % — ABNORMAL LOW (ref 34.0–46.6)
Hemoglobin: 10.4 g/dL — ABNORMAL LOW (ref 11.1–15.9)
MCH: 29.1 pg (ref 26.6–33.0)
MCHC: 33.4 g/dL (ref 31.5–35.7)
MCV: 87 fL (ref 79–97)
Platelets: 296 x10E3/uL (ref 150–450)
RBC: 3.57 x10E6/uL — ABNORMAL LOW (ref 3.77–5.28)
RDW: 12.9 % (ref 11.7–15.4)
WBC: 7.2 x10E3/uL (ref 3.4–10.8)

## 2024-05-10 LAB — COMPREHENSIVE METABOLIC PANEL WITH GFR
ALT: 40 IU/L — ABNORMAL HIGH (ref 0–32)
AST: 20 IU/L (ref 0–40)
Albumin: 3.5 g/dL — ABNORMAL LOW (ref 4.0–5.0)
Alkaline Phosphatase: 178 IU/L — ABNORMAL HIGH (ref 41–116)
BUN/Creatinine Ratio: 9 (ref 9–23)
BUN: 5 mg/dL — ABNORMAL LOW (ref 6–20)
Bilirubin Total: 0.2 mg/dL (ref 0.0–1.2)
CO2: 19 mmol/L — ABNORMAL LOW (ref 20–29)
Calcium: 8.8 mg/dL (ref 8.7–10.2)
Chloride: 104 mmol/L (ref 96–106)
Creatinine, Ser: 0.54 mg/dL — ABNORMAL LOW (ref 0.57–1.00)
Globulin, Total: 3 g/dL (ref 1.5–4.5)
Glucose: 111 mg/dL — ABNORMAL HIGH (ref 70–99)
Potassium: 4.2 mmol/L (ref 3.5–5.2)
Sodium: 137 mmol/L (ref 134–144)
Total Protein: 6.5 g/dL (ref 6.0–8.5)
eGFR: 134 mL/min/1.73 (ref >59)

## 2024-05-11 NOTE — Progress Notes (Signed)
 "  PRENATAL VISIT NOTE  Subjective:  Angelica Holmes is a 23 y.o. G1P0 at [redacted]w[redacted]d being seen today for ongoing prenatal care.  She is currently monitored for the following issues for this high-risk pregnancy and has Recurrent depressive disorder, current episode mild; Anxiety disorder; Post traumatic stress disorder (PTSD); Supervision of high risk pregnancy, antepartum; BMI 50.0-59.9, adult (HCC); Seizures (HCC); and Elevated blood pressure affecting pregnancy in third trimester, antepartum on their problem list.  Patient reports no bleeding, no contractions, no cramping, and no leaking.  Contractions: Not present. Vag. Bleeding: None.  Movement: Present. Denies leaking of fluid.   The following portions of the patient's history were reviewed and updated as appropriate: allergies, current medications, past family history, past medical history, past social history, past surgical history and problem list.   Objective:   Vitals:   05/09/24 1122  BP: 128/81  Pulse: 87  Weight: (!) 327 lb 3.2 oz (148.4 kg)    Fetal Status:  Fetal Heart Rate (bpm): 155   Movement: Present    General: Alert, oriented and cooperative. Patient is in no acute distress.  Skin: Skin is warm and dry. No rash noted.   Cardiovascular: Normal heart rate noted  Respiratory: Normal respiratory effort, no problems with respiration noted  Abdomen: Soft, gravid, appropriate for gestational age.  Pain/Pressure: Absent     Pelvic: Cervical exam deferred        Extremities: Normal range of motion.  Edema: None  Mental Status: Normal mood and affect. Normal behavior. Normal judgment and thought content.      12/26/2023    3:31 PM 09/28/2022   11:35 AM 06/23/2022    2:10 PM  Depression screen PHQ 2/9  Decreased Interest 0 2 1  Down, Depressed, Hopeless 0 2 1  PHQ - 2 Score 0 4 2  Altered sleeping 3 2 1   Tired, decreased energy 3 2 1   Change in appetite 1 1 0  Feeling bad or failure about yourself  0 1 1  Trouble  concentrating 0 1 0  Moving slowly or fidgety/restless 0 2 0  Suicidal thoughts 0 0 0  PHQ-9 Score 7  13  5       Data saved with a previous flowsheet row definition        12/26/2023    3:31 PM 09/28/2022   11:40 AM 06/23/2022    2:10 PM  GAD 7 : Generalized Anxiety Score  Nervous, Anxious, on Edge 1 3 3   Control/stop worrying 1 2 3   Worry too much - different things 0 3 3  Trouble relaxing 1 3 3   Restless 0 2 3  Easily annoyed or irritable 0 2 3  Afraid - awful might happen 0 2 2  Total GAD 7 Score 3 17 20     Assessment and Plan:  Pregnancy: G1P0 at [redacted]w[redacted]d 1. Supervision of high risk pregnancy, antepartum (Primary) FHR BP appropriate today - Blood Glucose Monitoring Suppl (ACCU-CHEK GUIDE) w/Device KIT; Use to test blood sugars in the morning, at noon, in the evening, and at bedtime.  Dispense: 1 kit; Refill: 0 - Accu-Chek Softclix Lancets lancets; Use four times daily as instructed.  Dispense: 100 each; Refill: 12 - glucose blood (ACCU-CHEK GUIDE TEST) test strip; Use to test blood sugars 4 times daily as instructed.  Dispense: 100 each; Refill: 12 - CBC - Comp Met (CMET) - Protein / creatinine ratio, urine  2. Recurrent depressive disorder, current episode mild Currently doing well  3. Seizures (  HCC) No recent seizures on Keppra   4. BMI 50.0-59.9, adult (HCC) On ASA  5. Elevated blood pressure affecting pregnancy in third trimester, antepartum Blood pressure was elevated at MFM appointment.  Within normal limits today.  Will get baseline labs per MFM request  6. Diet controlled gestational diabetes mellitus (GDM) in third trimester Patient has gestational diabetes but has not yet picked up testing kit.  She will start testing 4 times daily and bring log to next visit. - Blood Glucose Monitoring Suppl (ACCU-CHEK GUIDE) w/Device KIT; Use to test blood sugars in the morning, at noon, in the evening, and at bedtime.  Dispense: 1 kit; Refill: 0 - Accu-Chek Softclix Lancets  lancets; Use four times daily as instructed.  Dispense: 100 each; Refill: 12 - glucose blood (ACCU-CHEK GUIDE TEST) test strip; Use to test blood sugars 4 times daily as instructed.  Dispense: 100 each; Refill: 12  7. [redacted] weeks gestation of pregnancy   Preterm labor symptoms and general obstetric precautions including but not limited to vaginal bleeding, contractions, leaking of fluid and fetal movement were reviewed in detail with the patient. Please refer to After Visit Summary for other counseling recommendations.   No follow-ups on file.  Future Appointments  Date Time Provider Department Center  05/23/2024 11:15 AM Ilean Norleen GAILS, MD [facility]  06/05/2024 11:15 AM Lola Donnice HERO, MD [facility]  06/06/2024 11:15 AM [facility] PROVIDER 1 [facility]  06/06/2024 11:30 AM WMC-MFC US1 [facility]  06/19/2024 11:15 AM Lola Donnice HERO, MD [facility]  06/26/2024 11:15 AM Lola Donnice HERO, MD [facility]  07/03/2024 11:15 AM WMC-CWH MOM BABY DYAD PROVIDER [facility]  07/10/2024 11:15 AM WMC-CWH MOM BABY DYAD PROVIDER [facility]  08/14/2024 11:15 AM WMC-CWH MOM BABY DYAD PROVIDER [facility]MBD [facility]    Norleen GAILS Ilean, MD  "

## 2024-05-23 ENCOUNTER — Other Ambulatory Visit: Payer: Self-pay

## 2024-05-23 ENCOUNTER — Ambulatory Visit: Admitting: Family Medicine

## 2024-05-23 ENCOUNTER — Other Ambulatory Visit

## 2024-05-23 VITALS — BP 114/73 | HR 111 | Wt 327.0 lb

## 2024-05-23 DIAGNOSIS — Z2911 Encounter for prophylactic immunotherapy for respiratory syncytial virus (RSV): Secondary | ICD-10-CM

## 2024-05-23 DIAGNOSIS — Z3A33 33 weeks gestation of pregnancy: Secondary | ICD-10-CM

## 2024-05-23 DIAGNOSIS — Z6841 Body Mass Index (BMI) 40.0 and over, adult: Secondary | ICD-10-CM

## 2024-05-23 DIAGNOSIS — F330 Major depressive disorder, recurrent, mild: Secondary | ICD-10-CM

## 2024-05-23 DIAGNOSIS — O0990 Supervision of high risk pregnancy, unspecified, unspecified trimester: Secondary | ICD-10-CM

## 2024-05-23 DIAGNOSIS — O0993 Supervision of high risk pregnancy, unspecified, third trimester: Secondary | ICD-10-CM | POA: Diagnosis not present

## 2024-05-23 DIAGNOSIS — O163 Unspecified maternal hypertension, third trimester: Secondary | ICD-10-CM | POA: Diagnosis not present

## 2024-05-23 DIAGNOSIS — R569 Unspecified convulsions: Secondary | ICD-10-CM | POA: Diagnosis not present

## 2024-05-23 NOTE — Progress Notes (Signed)
 "  PRENATAL VISIT NOTE  Subjective:  Angelica Holmes is a 23 y.o. G1P0 at [redacted]w[redacted]d being seen today for ongoing prenatal care.  She is currently monitored for the following issues for this high-risk pregnancy and has Recurrent depressive disorder, current episode mild; Anxiety disorder; Post traumatic stress disorder (PTSD); Supervision of high risk pregnancy, antepartum; BMI 50.0-59.9, adult (HCC); Seizures (HCC); and Elevated blood pressure affecting pregnancy in third trimester, antepartum on their problem list.  Patient reports no bleeding, no contractions, no cramping, and no leaking.  Contractions: Not present. Vag. Bleeding: None.  Movement: Present. Denies leaking of fluid.   The following portions of the patient's history were reviewed and updated as appropriate: allergies, current medications, past family history, past medical history, past social history, past surgical history and problem list.   Objective:   Vitals:   05/23/24 1126  BP: 114/73  Pulse: (!) 111  Weight: (!) 327 lb (148.3 kg)    Fetal Status:  Fetal Heart Rate (bpm): 142   Movement: Present    General: Alert, oriented and cooperative. Patient is in no acute distress.  Skin: Skin is warm and dry. No rash noted.   Cardiovascular: Normal heart rate noted  Respiratory: Normal respiratory effort, no problems with respiration noted  Abdomen: Soft, gravid, appropriate for gestational age.  Pain/Pressure: Absent     Pelvic: Cervical exam deferred        Extremities: Normal range of motion.  Edema: None  Mental Status: Normal mood and affect. Normal behavior. Normal judgment and thought content.      12/26/2023    3:31 PM 09/28/2022   11:35 AM 06/23/2022    2:10 PM  Depression screen PHQ 2/9  Decreased Interest 0 2 1  Down, Depressed, Hopeless 0 2 1  PHQ - 2 Score 0 4 2  Altered sleeping 3 2 1   Tired, decreased energy 3 2 1   Change in appetite 1 1 0  Feeling bad or failure about yourself  0 1 1  Trouble  concentrating 0 1 0  Moving slowly or fidgety/restless 0 2 0  Suicidal thoughts 0 0 0  PHQ-9 Score 7  13  5       Data saved with a previous flowsheet row definition        12/26/2023    3:31 PM 09/28/2022   11:40 AM 06/23/2022    2:10 PM  GAD 7 : Generalized Anxiety Score  Nervous, Anxious, on Edge 1 3 3   Control/stop worrying 1 2 3   Worry too much - different things 0 3 3  Trouble relaxing 1 3 3   Restless 0 2 3  Easily annoyed or irritable 0 2 3  Afraid - awful might happen 0 2 2  Total GAD 7 Score 3 17 20     Assessment and Plan:  Pregnancy: G1P0 at [redacted]w[redacted]d 1. Supervision of high risk pregnancy, antepartum (Primary) FHR and BP appropriate today - Respiratory syncytial virus vaccine, preF, subunit, bivalent,(Abrysvo)  2. Recurrent depressive disorder, current episode mild Currently doing well, no concerns at this time  3. Seizures (HCC) On Keppra , no recent seizures  4. Elevated blood pressure affecting pregnancy in third trimester, antepartum Patient had a previous visit with MFM with an elevated blood pressure but was within normal limits today.  Baseline labs collected at last visit and they were within normal limits.  5. BMI 50.0-59.9, adult (HCC) On ASA  6. [redacted] weeks gestation of pregnancy  7. Need for RSV vaccination Received today -  Respiratory syncytial virus vaccine, preF, subunit, bivalent,(Abrysvo)  Preterm labor symptoms and general obstetric precautions including but not limited to vaginal bleeding, contractions, leaking of fluid and fetal movement were reviewed in detail with the patient. Please refer to After Visit Summary for other counseling recommendations.   No follow-ups on file.  Future Appointments  Date Time Provider Department Center  06/05/2024 11:15 AM Lola Donnice HERO, MD [facility]  06/06/2024 11:15 AM W[facility]-MFC PROVIDER 1 [facility]  06/06/2024 11:30 AM WMC-MFC US1 [facility]  06/19/2024 11:15 AM Lola Donnice HERO, MD [facility]   06/26/2024 11:15 AM Lola Donnice HERO, MD [facility]  07/03/2024 11:15 AM WMC-CWH MOM BABY DYAD PROVIDER [facility]  07/10/2024 11:15 AM WMC-CWH MOM BABY DYAD PROVIDER [facility]  08/14/2024 11:15 AM WMC-CWH MOM BABY DYAD PROVIDER [facility]MBD [facility]    Norleen LULLA Rover, MD  "

## 2024-05-24 NOTE — L&D Delivery Note (Signed)
 OB/[facility]N Faculty Practice Delivery Note  Angelica Holmes is a 24 y.o. G1P0 s/p SVD at [redacted]w[redacted]d. She was admitted for IOL d/t gHTN.    GBS Status: Negative/-- (01/22 1558)    Labor Progress: Initial SVE: 0/thick/high. She then progressed to complete.   Delivery Date/Time: 06/26/24 at 11:35 Delivery: Called to room and patient was complete and pushing. Head delivered LOA. Nuchal cord present x1. Shoulder and body delivered in usual fashion and then nuchal reduced.. Infant with spontaneous cry, placed on mother's abdomen, dried and stimulated. Cord clamped x 2 almost immediately due to poor tone, and cut by FOB. Arterial gas obtained. Placenta delivered spontaneously with gentle cord traction. Labia, perineum, vagina, and cervix inspected inspected with first degree perineal laceration noted  Fundus difficult to massage due to body habitus, but patient continued to briskly bleed after delivery of the placenta and initiation of postpartum pitocin .. EBL at this point noted to be about 400. Dose of methergine  given, with continued bleeding noted. Uterine sweep performed and multiple clots evacuated which increased bleeding. TXA and cyotec given. At this point EBL 1200 so code hemorrhage called. Uterus again swept for clots and JADA placed. JADA was not suctioning, so JADA removed and another placed by Dr. Sudie. Suction still malfunctioning, suction tube replaced and JADA confirmed to be working correctly. Fundus firm with massage. Due to blood loss, will transfuse 2u pRBC's.   Baby Weight: pending  Placenta: Sent to L&D Complications: PPH Lacerations: Hemostatic EBL: 2000 mL Analgesia: Epidural   Infant:  APGAR (1 MIN): 7  APGAR (5 MINS): 9

## 2024-05-25 ENCOUNTER — Other Ambulatory Visit: Payer: Self-pay

## 2024-05-25 ENCOUNTER — Other Ambulatory Visit (HOSPITAL_COMMUNITY): Payer: Self-pay

## 2024-05-28 ENCOUNTER — Other Ambulatory Visit (HOSPITAL_COMMUNITY): Payer: Self-pay

## 2024-05-30 ENCOUNTER — Other Ambulatory Visit (HOSPITAL_COMMUNITY): Payer: Self-pay

## 2024-05-30 MED ORDER — LEVETIRACETAM ER 500 MG PO TB24: 2000.0000 mg | ORAL_TABLET | Freq: Every day | ORAL | 5 refills | Status: AC

## 2024-05-30 NOTE — Progress Notes (Signed)
 " [address] [place], [place] [address]    O: [phone] F [phone]                               Kayla Shutter MD                  [facility]         Angelica Holmes is a 24 y.o. right handedfemale. who presents at the request of Dr. Igbide for neurology follow-up.  HISTORY OF PRESENT ILLNESS This G1P0 34w 0d  presents for neurology follow-up.   Patient says she had a seizure in November and Keppra  level was low.  Dose was doubled now taking at 1000 mg twice a day.  Feels well with this.  Denies drowsiness or irritability.  Has been seizure-free since.  Says having an ultrasound next week to plan when her delivery will be and it might be a week or 2 early because the baby is in the 85th percentile.     PAST MEDICAL HISTORY   Medical History[1]  PAST SURGICAL HISTORY    has no past surgical history on file.  ALLERGIES   Allergies[2]  MEDICATIONS   Current Medications[3]  FAMILY HISTORY   Family History[4]  SOCIAL HISTORY   Tobacco Use History[5] Social History   Substance and Sexual Activity  Alcohol Use Never    REVIEW OF SYSTEMS Review of Systems  Neurological:  Negative for seizures.     Objective:  VITAL SIGNS BP 139/81 (BP Location: Left arm, Patient Position: Sitting)   Pulse 103   Wt (!) 150 kg (330 lb)   LMP 09/14/2023   BMI 58.46 kg/m   Physical Exam Constitutional:      Appearance: She is obese.  HENT:     Head: Normocephalic and atraumatic.  Eyes:     Extraocular Movements: Extraocular movements intact.  Cardiovascular:     Rate and Rhythm: Normal rate and regular rhythm.  Pulmonary:     Effort: Pulmonary effort is normal.  Musculoskeletal:        General: Normal range of motion.     Cervical back: Normal range of motion.  Neurological:     Mental Status: She is alert.     Neurological Exam  Mental Status Alert. Oriented to person, place and time. Able to name objects.   Cranial  Nerves CN III, IV, VI: Extraocular movements intact bilaterally. CN VII: Full and symmetric facial movement.  Motor Normal muscle bulk throughout. Normal muscle tone.  No pronator drift.    Sensory Light touch is normal in upper and lower extremities.   Reflexes                                          Right                      Left Biceps                                 Tr                         Tr Patellar  Tr                         Tr   Coordination Right: Finger-to-nose normal. Left: Finger-to-nose normal.  Gait Casual gait is normal including stance, stride, and arm swing.  Normal tandem gait.     Visit Diagnosis ICD-10-CM Orders  1. Convulsions, unspecified convulsion type    (CMD)  R56.9 levETIRAcetam  (KEPPRA  XR) 500 mg Tb24 Levetiracetam  level   Patient is now seizure-free on Keppra  monotherapy 2000 mg daily.  Given her refill to reflect this dosage.  I am going to recheck her Keppra  level today.  May need to increase the dose further prior to delivery but the patient understands she will revert back to 1000 mg dosage within the few months postdelivery.  Patient was once again on driving restriction. The patient has been counseled in detail:  As per [place]  state although patient cannot drive until 6 months seizure-free.  Operating heavy machinery is prohibited.  No climbing heights.  No swimming alone.  In general the patient should avoid any activity where the loss of consciousness could    lead to harm to self or others.     2. IUP (intrauterine pregnancy), incidental (CMD)  Z33.1    Patient says that she is [redacted] weeks pregnant.  She has been taking Prenatal vitamin and that should be continued.  Keppra  as well as Keppra  XR are considered safe in pregnancy however we will wait her ultrasounds to be sure of this.  Patient will follow-up with obstetrics.    See the patient back for follow-up.  She understands to contact the  office for any question or concern.  Kayla Shutter MD        [1] Past Medical History: Diagnosis Date   ADHD 08/03/2017   Allergic    Anemia 01/05/2023   Anxiety    Asthma (CMD)    Depression    Eczema    HTN (hypertension)    Obesity 04/28/2011   Obesity    PTSD (post-traumatic stress disorder)    PTSD (post-traumatic stress disorder)    Seizures    (CMD)    Vision abnormalities    Pt wears glasses  [2] No Known Allergies [3] Current Outpatient Medications  Medication Sig Dispense Refill   albuterol  HFA (PROVENTIL  HFA;VENTOLIN  HFA;PROAIR  HFA) 90 mcg/actuation inhaler Inhale 2 puffs every 6 (six) hours as needed for wheezing. 1 each 5   triamcinolone  acetonide (KENALOG ) 0.1 % cream Apply topically 2 (two) times a day. 45 g 0   levETIRAcetam  (KEPPRA  XR) 500 mg Tb24 Take 4 tablets (2,000 mg total) by mouth daily. 120 tablet 5   No current facility-administered medications for this visit.  [4] Family History Problem Relation Name Age of Onset   COPD Maternal Grandmother Cozette Hatchett    Endometriosis Mother     Bipolar disorder Mother     Depression Mother     Mental illness Mother     Eczema Mother     Eczema Sister     Eczema Brother     Diabetes Neg Hx     Learning disabilities Neg Hx     Intellectual Disability Neg Hx     Vision loss Neg Hx     Stroke Neg Hx     Miscarriages / Stillbirths Neg Hx     Kidney disease Neg Hx     Hypertension Neg Hx     Hyperlipidemia Neg Hx  Heart disease Neg Hx     Hearing loss Neg Hx     Early death Neg Hx     Drug abuse Neg Hx     Cancer Neg Hx     Birth defects Neg Hx     Asthma Neg Hx     Arthritis Neg Hx     Alcohol abuse Neg Hx    [5] Social History Tobacco Use  Smoking Status Never  Smokeless Tobacco Never  "

## 2024-06-05 ENCOUNTER — Ambulatory Visit: Admitting: Family Medicine

## 2024-06-05 ENCOUNTER — Encounter: Payer: Self-pay | Admitting: Family Medicine

## 2024-06-05 ENCOUNTER — Other Ambulatory Visit: Payer: Self-pay

## 2024-06-05 VITALS — BP 130/74 | HR 118 | Wt 328.3 lb

## 2024-06-05 DIAGNOSIS — R569 Unspecified convulsions: Secondary | ICD-10-CM

## 2024-06-05 DIAGNOSIS — Z3A34 34 weeks gestation of pregnancy: Secondary | ICD-10-CM

## 2024-06-05 DIAGNOSIS — O0993 Supervision of high risk pregnancy, unspecified, third trimester: Secondary | ICD-10-CM

## 2024-06-05 DIAGNOSIS — O0990 Supervision of high risk pregnancy, unspecified, unspecified trimester: Secondary | ICD-10-CM

## 2024-06-05 DIAGNOSIS — O163 Unspecified maternal hypertension, third trimester: Secondary | ICD-10-CM

## 2024-06-05 DIAGNOSIS — O24410 Gestational diabetes mellitus in pregnancy, diet controlled: Secondary | ICD-10-CM

## 2024-06-05 DIAGNOSIS — F419 Anxiety disorder, unspecified: Secondary | ICD-10-CM

## 2024-06-05 NOTE — Patient Instructions (Signed)

## 2024-06-05 NOTE — Progress Notes (Signed)
" ° °  Subjective:  Angelica Holmes is a 24 y.o. G1P0 at [redacted]w[redacted]d being seen today for ongoing prenatal care.  She is currently monitored for the following issues for this high-risk pregnancy and has Recurrent depressive disorder, current episode mild; Anxiety disorder; Post traumatic stress disorder (PTSD); Supervision of high risk pregnancy, antepartum; BMI 50.0-59.9, adult (HCC); Seizures (HCC); Elevated blood pressure affecting pregnancy in third trimester, antepartum; and Diet controlled gestational diabetes mellitus (GDM) in third trimester on their problem list.  Patient reports no complaints.  Contractions: Not present. Vag. Bleeding: None.  Movement: Present. Denies leaking of fluid.   The following portions of the patient's history were reviewed and updated as appropriate: allergies, current medications, past family history, past medical history, past social history, past surgical history and problem list. Problem list updated.  Objective:   Vitals:   06/05/24 1131  BP: 130/74  Pulse: (!) 118  Weight: (!) 328 lb 5 oz (148.9 kg)    Fetal Status: Fetal Heart Rate (bpm): 140   Movement: Present     General:  Alert, oriented and cooperative. Patient is in no acute distress.  Skin: Skin is warm and dry. No rash noted.   Cardiovascular: Normal heart rate noted  Respiratory: Normal respiratory effort, no problems with respiration noted  Abdomen: Soft, gravid, appropriate for gestational age. Pain/Pressure: Absent     Pelvic: Vag. Bleeding: None     Cervical exam deferred        Extremities: Normal range of motion.  Edema: None  Mental Status: Normal mood and affect. Normal behavior. Normal judgment and thought content.   Urinalysis:        Assessment and Plan:  Pregnancy: G1P0 at [redacted]w[redacted]d  1. Supervision of high risk pregnancy, antepartum (Primary) BP and FHR normal Swabs next visit  2. Seizures (HCC) Last seizure in November Currently on Keppra  1g BID Last saw neurologist on  05/30/2024, continue current therapy with plan to drop back down to pre-pregnancy dose after delivery  3. Elevated blood pressure affecting pregnancy in third trimester, antepartum x1 with MFM, all BP's in office have been normal Baseline labs at that time with ALT mildly elevated at 40, otherwise CBC/CMP were unremarkable  4. Diet controlled gestational diabetes mellitus (GDM) in third trimester Log reviewed, see above A few explanations: reports she is ont a full 8 hours fasting for her AM values and is tending to only have one meal a day Also endorses drinking apple juice late at night Discussed importance of adhering to diet, I think we are borderline for starting a med but willing to wait and see Has f/u growth US  scheduled for tomorrow Also discussed timing of delivery will depend on sugars, growth US , etc. Somewhere between 37-39 weeks  5. Anxiety disorder, unspecified type A little up because of GDM but overall reports she is doing well  Preterm labor symptoms and general obstetric precautions including but not limited to vaginal bleeding, contractions, leaking of fluid and fetal movement were reviewed in detail with the patient. Please refer to After Visit Summary for other counseling recommendations.  Return in 2 weeks (on 06/19/2024) for Dyad patient, ob visit.   [name], MD "

## 2024-06-06 ENCOUNTER — Ambulatory Visit: Attending: Maternal & Fetal Medicine

## 2024-06-06 ENCOUNTER — Ambulatory Visit: Admitting: Maternal & Fetal Medicine

## 2024-06-06 ENCOUNTER — Encounter (HOSPITAL_COMMUNITY): Payer: Self-pay | Admitting: Obstetrics and Gynecology

## 2024-06-06 ENCOUNTER — Inpatient Hospital Stay (HOSPITAL_COMMUNITY)
Admission: AD | Admit: 2024-06-06 | Discharge: 2024-06-06 | Disposition: A | Discharge status: Home / Self Care | Attending: Obstetrics and Gynecology | Admitting: Obstetrics and Gynecology

## 2024-06-06 ENCOUNTER — Other Ambulatory Visit: Payer: Self-pay

## 2024-06-06 VITALS — BP 140/69 | HR 98

## 2024-06-06 DIAGNOSIS — Z3A30 30 weeks gestation of pregnancy: Secondary | ICD-10-CM

## 2024-06-06 DIAGNOSIS — E669 Obesity, unspecified: Secondary | ICD-10-CM | POA: Diagnosis not present

## 2024-06-06 DIAGNOSIS — O24410 Gestational diabetes mellitus in pregnancy, diet controlled: Secondary | ICD-10-CM | POA: Insufficient documentation

## 2024-06-06 DIAGNOSIS — O26893 Other specified pregnancy related conditions, third trimester: Secondary | ICD-10-CM | POA: Insufficient documentation

## 2024-06-06 DIAGNOSIS — Z3A35 35 weeks gestation of pregnancy: Secondary | ICD-10-CM | POA: Insufficient documentation

## 2024-06-06 DIAGNOSIS — O99353 Diseases of the nervous system complicating pregnancy, third trimester: Secondary | ICD-10-CM | POA: Diagnosis not present

## 2024-06-06 DIAGNOSIS — O24419 Gestational diabetes mellitus in pregnancy, unspecified control: Secondary | ICD-10-CM | POA: Insufficient documentation

## 2024-06-06 DIAGNOSIS — G40909 Epilepsy, unspecified, not intractable, without status epilepticus: Secondary | ICD-10-CM | POA: Diagnosis not present

## 2024-06-06 DIAGNOSIS — O163 Unspecified maternal hypertension, third trimester: Secondary | ICD-10-CM | POA: Diagnosis not present

## 2024-06-06 DIAGNOSIS — O0993 Supervision of high risk pregnancy, unspecified, third trimester: Secondary | ICD-10-CM | POA: Diagnosis present

## 2024-06-06 DIAGNOSIS — R030 Elevated blood-pressure reading, without diagnosis of hypertension: Secondary | ICD-10-CM | POA: Diagnosis not present

## 2024-06-06 DIAGNOSIS — O0990 Supervision of high risk pregnancy, unspecified, unspecified trimester: Secondary | ICD-10-CM

## 2024-06-06 DIAGNOSIS — O99213 Obesity complicating pregnancy, third trimester: Secondary | ICD-10-CM | POA: Diagnosis not present

## 2024-06-06 DIAGNOSIS — O10913 Unspecified pre-existing hypertension complicating pregnancy, third trimester: Secondary | ICD-10-CM | POA: Diagnosis present

## 2024-06-06 DIAGNOSIS — Z7982 Long term (current) use of aspirin: Secondary | ICD-10-CM | POA: Insufficient documentation

## 2024-06-06 DIAGNOSIS — O133 Gestational [pregnancy-induced] hypertension without significant proteinuria, third trimester: Secondary | ICD-10-CM | POA: Insufficient documentation

## 2024-06-06 LAB — CBC
HCT: 32.9 % — ABNORMAL LOW (ref 36.0–46.0)
Hemoglobin: 10.9 g/dL — ABNORMAL LOW (ref 12.0–15.0)
MCH: 28.9 pg (ref 26.0–34.0)
MCHC: 33.1 g/dL (ref 30.0–36.0)
MCV: 87.3 fL (ref 80.0–100.0)
Platelets: 299 K/uL (ref 150–400)
RBC: 3.77 MIL/uL — ABNORMAL LOW (ref 3.87–5.11)
RDW: 13.7 % (ref 11.5–15.5)
WBC: 6.4 K/uL (ref 4.0–10.5)
nRBC: 0 % (ref 0.0–0.2)

## 2024-06-06 LAB — COMPREHENSIVE METABOLIC PANEL WITH GFR
ALT: 58 U/L — ABNORMAL HIGH (ref 0–44)
AST: 27 U/L (ref 15–41)
Albumin: 3.4 g/dL — ABNORMAL LOW (ref 3.5–5.0)
Alkaline Phosphatase: 238 U/L — ABNORMAL HIGH (ref 38–126)
Anion gap: 13 (ref 5–15)
BUN: <5 mg/dL — ABNORMAL LOW (ref 6–20)
CO2: 20 mmol/L — ABNORMAL LOW (ref 22–32)
Calcium: 9.2 mg/dL (ref 8.9–10.3)
Chloride: 104 mmol/L (ref 98–111)
Creatinine, Ser: 0.62 mg/dL (ref 0.44–1.00)
GFR, Estimated: >60 mL/min
Glucose, Bld: 99 mg/dL (ref 70–99)
Potassium: 4 mmol/L (ref 3.5–5.1)
Sodium: 137 mmol/L (ref 135–145)
Total Bilirubin: 0.3 mg/dL (ref 0.0–1.2)
Total Protein: 7.3 g/dL (ref 6.5–8.1)

## 2024-06-06 LAB — URINALYSIS, ROUTINE W REFLEX MICROSCOPIC
Bilirubin Urine: NEGATIVE
Glucose, UA: NEGATIVE mg/dL
Ketones, ur: 20 mg/dL — AB
Leukocytes,Ua: NEGATIVE
Nitrite: NEGATIVE
Protein, ur: 100 mg/dL — AB
Specific Gravity, Urine: 1.026 (ref 1.005–1.030)
pH: 6 (ref 5.0–8.0)

## 2024-06-06 LAB — PROTEIN / CREATININE RATIO, URINE
Creatinine, Urine: 381 mg/dL
Protein Creatinine Ratio: 0.1 mg/mg
Total Protein, Urine: 52 mg/dL

## 2024-06-06 NOTE — Discharge Instructions (Signed)
 Reasons to return to MAU at [facility]: Your blood pressure is >160/110. You have a headache that will not go away after having something to eat, something to drink, and Tylenol . You have changes in your vision or upper abdominal pain that does not get better with Tylenol . Less than 36 weeks: Contractions feels like menstrual cramps. You should go to the hospital if you have more than 6 contractions in an hour, even after you have rested and drank at least 16 ounces of water.  More than 36 weeks: You begin to have strong, frequent contractions 5 minutes apart or less, each last 1 minute, these have been going on for 1-2 hours, and you cannot walk or talk during them. Your water breaks.  Sometimes it is a big gush of fluid. However, many times it may it may be much more subtle. You should go to the hospital if you have a constant leakage of fluid from your vagina, enough to soak a pad when you are walking around.  You have vaginal bleeding.  It is normal to have a small amount of spotting if your cervix was checked. If you have bleeding requiring the use of a pad, go to the hospital. You don't feel your baby moving like normal.  If you think that you baby's movement is decreased, eat a snack and rest on your left side in a quiet room for one hour. If you have not felt the baby move more than 6 times in an hour GO TO THE HOSPITAL.

## 2024-06-06 NOTE — Progress Notes (Signed)
 "  Patient information  Patient Name: Angelica Holmes  Patient MRN:   [id number]  Referring practice: MFM Referring Provider: [facility])  Problem List   Patient Active Problem List   Diagnosis Date Noted   Diet controlled gestational diabetes mellitus (GDM) in third trimester 06/05/2024   Elevated blood pressure affecting pregnancy in third trimester, antepartum 05/07/2024   BMI 50.0-59.9, adult (HCC) 12/27/2023   Seizures (HCC) 12/27/2023   Supervision of high risk pregnancy, antepartum 11/29/2023   Recurrent depressive disorder, current episode mild 04/29/2019   Anxiety disorder 03/13/2019   Post traumatic stress disorder (PTSD) 03/13/2019   Maternal Fetal medicine Consult  DEBORAH LAZCANO is a 24 y.o. G1P0 at [redacted]w[redacted]d here for ultrasound and consultation. Zelina M Grewell is doing well today with no acute concerns. Today we focused on the following:   The patient is here for a growth ultrasound and biophysical profile. Today, the biophysical profile is reassuring at 8/8, and fetal growth is appropriate for gestational age. She has gestational diabetes that is largely diet controlled. She reports some difficulty with elevated fasting blood glucose values; however, the addition of a high-protein nighttime snack has resulted in significant improvement.  Today, her blood pressure is mildly elevated at 140/69 mmHg with a repeat measurement of 142/80 mmHg. She denies any signs or symptoms of preeclampsia. We discussed the differential diagnosis of isolated elevated blood pressure versus gestational hypertension versus preeclampsia and the need for prompt evaluation. I reviewed that if she meets criteria for gestational hypertension or preeclampsia without severe features, delivery at [redacted] weeks gestation is recommended, and if severe features are present, earlier delivery may be indicated.  She will proceed directly to the maternal assessment unit for blood pressure  monitoring and laboratory evaluation. The MAU providers have been notified and are expecting her arrival. In the interim, she should continue her diabetic diet and ongoing surveillance.  Sonographic findings Single intrauterine pregnancy at 35w 0d. Fetal cardiac activity: Observed. Presentation: Cephalic. Interval fetal anatomy appears normal. Fetal biometry shows the estimated fetal weight of 6 lb 3 oz,  2803g (74%). Amniotic fluid: Within normal limits. AFI: 19.91 cm.  MVP: 7.96 cm. Placenta: Anterior. BPP: 8/8.   RECOMMENDATIONS -Proceed immediately to the MAU for blood pressure monitoring and preeclampsia laboratory evaluation -Continue diabetic diet with attention to fasting glucose control, including nighttime protein snack -Return for twice-weekly antenatal testing (NSTs) -Continue close blood pressure monitoring -Plan for delivery at 37 weeks if gestational hypertension or preeclampsia without severe features is diagnosed, or earlier if severe features develop. IF BP remains normal then a 39 week delivery is appropriate.  -The patient does not have a PNV next week. I instructed her to stop at the front desk to schedule this.   The patient had time to ask questions that were answered to her satisfaction.  She verbalized understanding and agrees to proceed with the plan above.   I spent 40 minutes reviewing the patients chart, including labs and images as well as counseling the patient about her medical conditions. Greater than 50% of the time was spent in direct face-to-face patient counseling.  Delora Smaller  MFM, [place]   06/06/2024  1:21 PM   Review of Systems: A review of systems was performed and was negative except per HPI   Vitals and Physical Exam    06/06/2024   12:33 PM 06/06/2024   11:23 AM 06/05/2024   11:31 AM  Vitals with BMI  Weight   328 lbs 5 oz  Systolic 142 140 869  Diastolic 80 69 74  Pulse 115 98 118    Sitting comfortably on the sonogram  table Nonlabored breathing Normal rate and rhythm Abdomen is nontender  Past pregnancies OB History  Gravida Para Term Preterm AB Living  1       SAB IAB Ectopic Multiple Live Births          # Outcome Date GA Lbr Len/2nd Weight Sex Type Anes PTL Lv  1 Current              Future Appointments  Date Time Provider Department Center  06/11/2024  9:45 AM [facility]ST [facility]  06/14/2024 10:45 AM [facility] NST [facility]  06/14/2024  1:35 PM Lola Donnice HERO, MD [facility]  06/18/2024 10:45 AM [facility] NST [facility]  06/21/2024 10:45 AM [facility] NST [facility]  06/21/2024  1:35 PM Lola Donnice HERO, MD [facility]  06/26/2024 11:15 AM Lola Donnice HERO, MD [facility]  07/03/2024 11:15 AM Lola Donnice HERO, MD [facility]  07/10/2024 11:15 AM Lola Donnice HERO, MD [facility]      "

## 2024-06-06 NOTE — MAU Note (Addendum)
 MAU Triage Note  Angelica Holmes is a 25 y.o. at [redacted]w[redacted]d here in MAU reporting: reports she had an U/S done today @ MFM and her BP was 140s/70s at the appointment, so they sent her here to be evaluated. Denies HA, epigastric pain, visual disturbances, and swelling. Endorses +FM, denies VB and LOF.    Onset of complaint: 1215 Pain score: denies Vitals:   06/06/24 1444  BP: 135/67  Pulse: 93  Resp: 17  Temp: 98.2 F (36.8 C)  SpO2: 100%     FHT: 155  Lab orders placed from triage: UA

## 2024-06-06 NOTE — MAU Provider Note (Signed)
 Chief Complaint:  Hypertension   HPI   None     Angelica Holmes is a 24 y.o. G1P0 at [redacted]w[redacted]d who presents to maternity admissions for evaluation of elevated blood pressure.  Pregnancy is complicated by gestational diabetes.  She was seen by MFM today for growth ultrasound and BPP and had mildly elevated blood pressures.  BPP 8/8 in the office today.  Dr. Lawton sent her to the MAU to rule out preeclampsia.  Denies headache, vision changes, RUQ pain, edema. Patient reports that a similar thing happened her last US  about a month ago where her BP was mildly elevated in the MFM office but normal otherwise, and preeclampsia labs were normal at her OB office visit 2 days later.  Pregnancy Course: Receives care at [facility]. Prenatal records reviewed.  Pregnancy complicated by gestational diabetes, seizures, BMI >50.  Past Medical History:  Diagnosis Date   Anxiety    Depression    Epilepsy (HCC)    PTSD (post-traumatic stress disorder)    OB History  Gravida Para Term Preterm AB Living  1       SAB IAB Ectopic Multiple Live Births          # Outcome Date GA Lbr Len/2nd Weight Sex Type Anes PTL Lv  1 Current            History reviewed. No pertinent surgical history. Family History  Problem Relation Age of Onset   ADD / ADHD Mother    Diabetes Mother    COPD Maternal Grandmother    Social History[1] Allergies[2] Medications Prior to Admission  Medication Sig Dispense Refill Last Dose/Taking   levETIRAcetam  (KEPPRA  XR) 500 MG 24 hr tablet Take 4 tablets (2,000 mg total) by mouth daily. 120 tablet 5 06/05/2024   Prenatal Vit-Fe Fumarate-FA (PRENATAL VITAMIN) 27-0.8 MG TABS Take 1 tablet by mouth daily. 30 tablet 11 06/05/2024   Accu-Chek Softclix Lancets lancets Use four times daily as instructed. 100 each 12    acetaminophen  (TYLENOL ) 500 MG tablet Take 2 tablets (1,000 mg total) by mouth every 6 (six) hours as needed for moderate  pain (pain score 4-6). 30 tablet 0    albuterol  (VENTOLIN  HFA) 108 (90 Base) MCG/ACT inhaler Inhale 2 puffs into the lungs every 6 (six) hours as needed for wheezing or shortness of breath.      albuterol  (VENTOLIN  HFA) 108 (90 Base) MCG/ACT inhaler Inhale 2 puffs into the lungs every 6 (six) hours as needed for wheezing 18 g 5    aspirin  EC 81 MG tablet Take 1 tablet (81 mg total) by mouth daily. Start taking when you are [redacted] weeks pregnant for rest of pregnancy for prevention of preeclampsia 90 tablet 3    Blood Glucose Monitoring Suppl (ACCU-CHEK GUIDE) w/Device KIT Use to test blood sugars in the morning, at noon, in the evening, and at bedtime. 1 kit 0    glucose blood (ACCU-CHEK GUIDE TEST) test strip Use to test blood sugars 4 times daily as instructed. 100 each 12    levETIRAcetam  (KEPPRA  XR) 500 MG 24 hr tablet Take 2 tablets (1,000 mg total) by mouth 2 (two) times daily. 30 tablet 5     I have reviewed patient's Past Medical Hx, Surgical Hx, Family Hx, Social Hx, medications and allergies.   ROS  Pertinent items noted in HPI and remainder of comprehensive ROS otherwise negative.   PHYSICAL EXAM  Patient Vitals for the past  24 hrs:  BP Temp Temp src Pulse Resp SpO2 Height Weight  06/06/24 1645 137/79 -- -- 94 -- 99 % -- --  06/06/24 1630 137/89 -- -- 93 -- 100 % -- --  06/06/24 1615 (!) 143/84 -- -- (!) 101 -- 100 % -- --  06/06/24 1600 136/78 -- -- 97 -- 100 % -- --  06/06/24 1545 (!) 141/82 -- -- 93 -- 100 % -- --  06/06/24 1530 135/71 -- -- 95 -- 100 % -- --  06/06/24 1515 139/75 -- -- 86 -- 100 % -- --  06/06/24 1500 (!) 142/73 -- -- (!) 102 -- 100 % -- --  06/06/24 1444 135/67 98.2 F (36.8 C) Oral 93 17 100 % -- --  06/06/24 1424 -- -- -- -- -- -- 5' 3 (1.6 m) (!) 144.2 kg    Constitutional: Well-developed, well-nourished female in no acute distress.  HEENT: atraumatic, normocephalic. Neck has normal ROM. EOM intact. Cardiovascular: normal rate & rhythm, warm and  well-perfused Respiratory: normal effort, no problems with respiration noted GI: Abd soft, non-tender, non-distended MSK: Extremities nontender, no edema, normal ROM Skin: warm and dry. Acyanotic, no jaundice or pallor. Neurologic: Alert and oriented x 4. No abnormal coordination. Psychiatric: Normal mood. Speech not slurred, not rapid/pressured. Patient is cooperative.  Fetal Tracing: Baseline FHR: 140 per minute, changed to 135 per minute at 1445 Fetal heart variability: moderate Fetal Heart Rate accelerations: yes Fetal Heart Rate decelerations: none Fetal Non-stress Test: Category I (reactive) Toco: no uterine contractions   Labs: Results for orders placed or performed during the hospital encounter of 06/06/24 (from the past 24 hours)  CBC     Status: Abnormal   Collection Time: 06/06/24  2:40 PM  Result Value Ref Range   WBC 6.4 4.0 - 10.5 K/uL   RBC 3.77 (L) 3.87 - 5.11 MIL/uL   Hemoglobin 10.9 (L) 12.0 - 15.0 g/dL   HCT 67.0 (L) 63.9 - 53.9 %   MCV 87.3 80.0 - 100.0 fL   MCH 28.9 26.0 - 34.0 pg   MCHC 33.1 30.0 - 36.0 g/dL   RDW 86.2 88.4 - 84.4 %   Platelets 299 150 - 400 K/uL   nRBC 0.0 0.0 - 0.2 %  Comprehensive metabolic panel with GFR     Status: Abnormal   Collection Time: 06/06/24  2:40 PM  Result Value Ref Range   Sodium 137 135 - 145 mmol/L   Potassium 4.0 3.5 - 5.1 mmol/L   Chloride 104 98 - 111 mmol/L   CO2 20 (L) 22 - 32 mmol/L   Glucose, Bld 99 70 - 99 mg/dL   BUN <5 (L) 6 - 20 mg/dL   Creatinine, Ser 9.37 0.44 - 1.00 mg/dL   Calcium 9.2 8.9 - 89.6 mg/dL   Total Protein 7.3 6.5 - 8.1 g/dL   Albumin 3.4 (L) 3.5 - 5.0 g/dL   AST 27 15 - 41 U/L   ALT 58 (H) 0 - 44 U/L   Alkaline Phosphatase 238 (H) 38 - 126 U/L   Total Bilirubin 0.3 0.0 - 1.2 mg/dL   GFR, Estimated >39 >39 mL/min   Anion gap 13 5 - 15  Protein / creatinine ratio, urine     Status: None   Collection Time: 06/06/24  2:44 PM  Result Value Ref Range   Creatinine, Urine 381 mg/dL    Total Protein, Urine 52 mg/dL   Protein Creatinine Ratio 0.1 <0.2 mg/mg  Urinalysis, Routine w reflex  microscopic -Urine, Clean Catch     Status: Abnormal   Collection Time: 06/06/24  2:44 PM  Result Value Ref Range   Color, Urine AMBER (A) YELLOW   APPearance HAZY (A) CLEAR   Specific Gravity, Urine 1.026 1.005 - 1.030   pH 6.0 5.0 - 8.0   Glucose, UA NEGATIVE NEGATIVE mg/dL   Hgb urine dipstick SMALL (A) NEGATIVE   Bilirubin Urine NEGATIVE NEGATIVE   Ketones, ur 20 (A) NEGATIVE mg/dL   Protein, ur 899 (A) NEGATIVE mg/dL   Nitrite NEGATIVE NEGATIVE   Leukocytes,Ua NEGATIVE NEGATIVE   RBC / HPF 11-20 0 - 5 RBC/hpf   WBC, UA 0-5 0 - 5 WBC/hpf   Bacteria, UA RARE (A) NONE SEEN   Squamous Epithelial / HPF 6-10 0 - 5 /HPF   Mucus PRESENT    Ca Oxalate Crys, UA PRESENT     Imaging:  US  MFM FETAL BPP WO NON STRESS Result Date: 06/06/2024 ----------------------------------------------------------------------  OBSTETRICS REPORT                       (Signed Final 06/06/2024 01:23 pm) ---------------------------------------------------------------------- Patient Info  ID #:       [id number]                          D.O.B.:  06/15/2000 (23 yrs)(F)  Name:       Angelica Holmes                Visit Date: 06/06/2024 12:00 pm ---------------------------------------------------------------------- Performed By  Attending:        Delora Smaller DO       Ref. Address:     [address]                                                             [place], [place]                                                             [address]  Performed By:     Nat GORMAN Plant     Location[facility]                    BS, RD[facility]                                 [facility]                                                             [facility]  Referred By:      [facility]  ---------------------------------------------------------------------- Orders  #  Description                           Code        Ordered By  1  US  MFM FETAL BPP WO NON               76819.01    BURK SCHAIBLE     STRESS  2  US  MFM OB FOLLOW UP                   [id number]    DELORA SMALLER ----------------------------------------------------------------------  #  Order #                     Accession #                Episode #  1  [id number]                   [id number]                 [id number]  2  [id number]                   [id number]                 [id number] ---------------------------------------------------------------------- Indications  Gestational diabetes in pregnancy, diet        O24.410  controlled  Elevated blood pressure affecting pregnancy    O13.3  in third trimester  [redacted] weeks gestation of pregnancy                Z3A.35  Obesity complicating pregnancy, third          O99.213  trimester (pregravid BMI 57)  Seizure disorder (Keppra )                      O99.350 G40.909  Low risk NIPS - Neg Horizon  Encounter for other antenatal screening        Z36.2  follow-up ---------------------------------------------------------------------- Vital Signs  BP:          142/80 ---------------------------------------------------------------------- Fetal Evaluation  Num Of Fetuses:         1  Fetal Heart Rate(bpm):  141  Cardiac Activity:       Observed  Presentation:           Cephalic  Placenta:               Anterior  P. Cord Insertion:      Previously seen  Amniotic Fluid  AFI FV:      Within normal limits  AFI Sum(cm)     %Tile       Largest Pocket(cm)  19.91           74          7.96  RUQ(cm)       RLQ(cm)       LUQ(cm)        LLQ(cm)  7.96          2.59          4.6            4.76 ---------------------------------------------------------------------- Biophysical Evaluation  Amniotic F.V:   Within normal limits  F. Tone:        Observed  F. Movement:    Observed                   Score:          8/8  F.  Breathing:   Observed ---------------------------------------------------------------------- Biometry  BPD:      88.9  mm     G. Age:  36w 0d         78  %    CI:        76.97   %    70 - 86                                                          FL/HC:      21.4   %    20.1 - 22.3  HC:      320.9  mm     G. Age:  36w 1d         46  %    HC/AC:      0.99        0.93 - 1.11  AC:      322.6  mm     G. Age:  36w 1d         86  %    FL/BPD:     77.3   %    71 - 87  FL:       68.7  mm     G. Age:  35w 2d         50  %    FL/AC:      21.3   %    20 - 24  LV:          3  mm  Est. FW:    2803  gm      6 lb 3 oz     74  %  Est. FW at 39 Wks:       3709  gm     8 lb 3 oz ---------------------------------------------------------------------- OB History  Gravidity:    1         Term:   0        Prem:   0        SAB:   0  TOP:          0       Ectopic:  0        Living: 0 ---------------------------------------------------------------------- Gestational Age  LMP:           38w 0d        Date:  09/14/23                  EDD:   06/20/24  U/S Today:     35w 6d                                        EDD:   07/05/24  Best:          35w 0d     Det. By:  JAYSON JONELLE CROME 1st  (12/26/23)    EDD:   07/11/24 ---------------------------------------------------------------------- Anatomy  Cranium:  Previously seen        Aortic Arch:            Previously seen  Cavum:                 Previously seen        Ductal Arch:            Previously seen  Ventricles:            Appears normal         Diaphragm:              Appears normal  Choroid Plexus:        Previously seen        Stomach:                Appears normal, left                                                                        sided  Cerebellum:            Previously seen        Abdomen:                Previously seen  Posterior Fossa:       Previously seen        Abdominal Wall:         Previously seen  Face:                  Orbits and profile     Cord Vessels:            Previously seen                         previously seen  Lips:                  Previously seen        Kidneys:                Appear normal  Thoracic:              Previously seen        Bladder:                Appears normal  Heart:                 Previously seen        Spine:                  Previously seen  RVOT:                  Previously seen        Upper Extremities:      Previously seen  LVOT:                  Previously seen        Lower Extremities:      Previously seen  Other:  Technically difficult due to maternal habitus and fetal position. ---------------------------------------------------------------------- Comments  Sonographic findings  Single intrauterine pregnancy at 35w 0d.  Fetal cardiac activity: Observed.  Presentation: Cephalic.  Interval fetal anatomy appears normal.  Fetal biometry shows the estimated fetal weight of 6 lb 3 oz,  2803g (74%).  Amniotic fluid: Within normal limits. AFI: 19.91 cm.  MVP:  7.96 cm.  Placenta: Anterior.  BPP: 8/8.  There are limitations of prenatal ultrasound such as the  inability to detect certain abnormalities due to poor  visualization. Various factors such as fetal position,  gestational age and maternal body habitus may increase the  difficulty in visualizing the fetal anatomy.  Recommendations  - See Epic note for assessment and plan of care. Any  referring office that does not utilize Epic will recieve a copy of  today's consult note via fax. Please contact our office with  any concerns. ----------------------------------------------------------------------                  Delora Smaller, DO Electronically Signed Final Report   06/06/2024 01:23 pm ----------------------------------------------------------------------   US  MFM OB FOLLOW UP Result Date: 06/06/2024 ----------------------------------------------------------------------  OBSTETRICS REPORT                       (Signed Final 06/06/2024 01:23 pm)  ---------------------------------------------------------------------- Patient Info  ID #:       [id number]                          D.O.B.:  [birth year] (23 yrs)(F)  Name:       Angelica Holmes                Visit Date: 06/06/2024 12:00 pm ---------------------------------------------------------------------- Performed By  Attending:        Delora Smaller DO       Ref. Address:     [address]                                                             [place], [place]                                                             [address]  Performed By:     Nat GORMAN Plant     Location[facility]                    BS, RD[facility]                                 [facility]  Referred By:      [facility] ---------------------------------------------------------------------- Orders  #  Description                           Code        Ordered By  1  US  MFM FETAL BPP WO NON               76819.01    BURK SCHAIBLE     STRESS  2  US  MFM OB FOLLOW UP                   [id number]    DELORA SMALLER ----------------------------------------------------------------------  #  Order #                     Accession #                Episode #  1  [id number]                   [id number]                 [id number]  2  [id number]                   [id number]                 [id number] ---------------------------------------------------------------------- Indications  Gestational diabetes in pregnancy, diet        O24.410  controlled  Elevated blood pressure affecting pregnancy    O13.3  in third trimester  [redacted] weeks gestation of pregnancy                Z3A.35  Obesity complicating pregnancy, third          O99.213  trimester (pregravid BMI 57)  Seizure disorder (Keppra )                      O99.350 G40.909  Low risk NIPS - Neg Horizon  Encounter for other  antenatal screening        Z36.2  follow-up ---------------------------------------------------------------------- Vital Signs  BP:          142/80 ---------------------------------------------------------------------- Fetal Evaluation  Num Of Fetuses:         1  Fetal Heart Rate(bpm):  141  Cardiac Activity:       Observed  Presentation:           Cephalic  Placenta:               Anterior  P. Cord Insertion:      Previously seen  Amniotic Fluid  AFI FV:      Within normal limits  AFI Sum(cm)     %Tile       Largest Pocket(cm)  19.91           74          7.96  RUQ(cm)       RLQ(cm)       LUQ(cm)        LLQ(cm)  7.96          2.59          4.6            4.76 ---------------------------------------------------------------------- Biophysical Evaluation  Amniotic F.V:   Within normal limits  F. Tone:        Observed  F. Movement:    Observed                   Score:          8/8  F. Breathing:   Observed ---------------------------------------------------------------------- Biometry  BPD:      88.9  mm     G. Age:  36w 0d         78  %    CI:        76.97   %    70 - 86                                                          FL/HC:      21.4   %    20.1 - 22.3  HC:      320.9  mm     G. Age:  36w 1d         46  %    HC/AC:      0.99        0.93 - 1.11  AC:      322.6  mm     G. Age:  36w 1d         86  %    FL/BPD:     77.3   %    71 - 87  FL:       68.7  mm     G. Age:  35w 2d         50  %    FL/AC:      21.3   %    20 - 24  LV:          3  mm  Est. FW:    2803  gm      6 lb 3 oz     74  %  Est. FW at 39 Wks:       3709  gm     8 lb 3 oz ---------------------------------------------------------------------- OB History  Gravidity:    1         Term:   0        Prem:   0        SAB:   0  TOP:          0       Ectopic:  0        Living: 0 ---------------------------------------------------------------------- Gestational Age  LMP:           38w 0d        Date:  09/14/23                  EDD:   06/20/24  U/S Today:      35w 6d                                        EDD:   07/05/24  Best:          35w 0d     Det. By:  JAYSON JONELLE CROME 1st  (12/26/23)    EDD:   07/11/24 ---------------------------------------------------------------------- Anatomy  Cranium:  Previously seen        Aortic Arch:            Previously seen  Cavum:                 Previously seen        Ductal Arch:            Previously seen  Ventricles:            Appears normal         Diaphragm:              Appears normal  Choroid Plexus:        Previously seen        Stomach:                Appears normal, left                                                                        sided  Cerebellum:            Previously seen        Abdomen:                Previously seen  Posterior Fossa:       Previously seen        Abdominal Wall:         Previously seen  Face:                  Orbits and profile     Cord Vessels:           Previously seen                         previously seen  Lips:                  Previously seen        Kidneys:                Appear normal  Thoracic:              Previously seen        Bladder:                Appears normal  Heart:                 Previously seen        Spine:                  Previously seen  RVOT:                  Previously seen        Upper Extremities:      Previously seen  LVOT:                  Previously seen        Lower Extremities:      Previously seen  Other:  Technically difficult due to maternal habitus and fetal position. ---------------------------------------------------------------------- Comments  Sonographic findings  Single intrauterine pregnancy at 35w 0d.  Fetal cardiac activity: Observed.  Presentation: Cephalic.  Interval fetal anatomy appears normal.  Fetal  biometry shows the estimated fetal weight of 6 lb 3 oz,  2803g (74%).  Amniotic fluid: Within normal limits. AFI: 19.91 cm.  MVP:  7.96 cm.  Placenta: Anterior.  BPP: 8/8.  There are limitations of prenatal ultrasound such as the  inability  to detect certain abnormalities due to poor  visualization. Various factors such as fetal position,  gestational age and maternal body habitus may increase the  difficulty in visualizing the fetal anatomy.  Recommendations  - See Epic note for assessment and plan of care. Any  referring office that does not utilize Epic will recieve a copy of  today's consult note via fax. Please contact our office with  any concerns. ----------------------------------------------------------------------                  Delora Smaller, DO Electronically Signed Final Report   06/06/2024 01:23 pm ----------------------------------------------------------------------   MDM & MAU COURSE  MDM: High  MAU Course: -Initial BP in MAU within normal limits. Will continue to monitor. -CMP, CBC, urine protein/creatinine ratio to rule out preeclampsia.  -CBC and urine protein/creatinine ratio within normal limits. -CMP with slightly elevated alkaline phosphatase and elevated ALT at 58 but <2x upper limit of normal. ALT was slightly elevated at 40 about 4 weeks ago as well so overall level is stable. -With no symptoms and normal labs, meets criteria for gHTN, has now had two elevated BP >4 hours apart. No severe range BP and no severe features. -Discussed with Dr. Cleatus, agrees with BP check on 1/16.  Differential diagnosis considered for elevated blood pressure includes but is not limited to: high risk conditions like preeclampsia or gestational hypertension; chronic hypertension; transient spurious elevated blood pressure   Orders Placed This Encounter  Procedures   Culture, OB Urine   CBC   Comprehensive metabolic panel with GFR   Protein / creatinine ratio, urine   Urinalysis, Routine w reflex microscopic -Urine, Clean Catch   Discharge patient   No orders of the defined types were placed in this encounter.   ASSESSMENT   1. Gestational hypertension, third trimester   2. [redacted] weeks gestation of pregnancy     PLAN   Discharge home in stable condition with preeclampsia precautions.  Plan for IOL at 37 weeks for gHTN. Next office visit on 06/15/2023, plan for weekly preeclampsia labs. Continue with NST twice weekly. Check BP at home. Messaged office to schedule BP check for Friday 1/16 in the office.   Follow-up Information     [facility] Follow up in 2 day(s).   Specialty: Obstetrics and Gynecology Why: blood pressure check Contact information: [address] [place]  [address] [phone]                 Allergies as of 06/06/2024   No Known Allergies      Medication List     TAKE these medications    Accu-Chek Guide Test test strip Generic drug: glucose blood Use to test blood sugars 4 times daily as instructed.   Accu-Chek Guide w/Device Kit Use to test blood sugars in the morning, at noon, in the evening, and at bedtime.   Accu-Chek Softclix Lancets lancets Use four times daily as instructed.   acetaminophen  500 MG tablet Commonly known as: TYLENOL  Take 2 tablets (1,000 mg total) by mouth every 6 (six) hours as needed for moderate pain (pain score 4-6).   albuterol  108 (90 Base) MCG/ACT inhaler Commonly known  as: VENTOLIN  HFA Inhale 2 puffs into the lungs every 6 (six) hours as needed for wheezing or shortness of breath.   Ventolin  HFA 108 (90 Base) MCG/ACT inhaler Generic drug: albuterol  Inhale 2 puffs into the lungs every 6 (six) hours as needed for wheezing   Aspirin  Low Dose 81 MG tablet Generic drug: aspirin  EC Take 1 tablet (81 mg total) by mouth daily. Start taking when you are [redacted] weeks pregnant for rest of pregnancy for prevention of preeclampsia   levETIRAcetam  500 MG 24 hr tablet Commonly known as: KEPPRA  XR Take 2 tablets (1,000 mg total) by mouth 2 (two) times daily.   levETIRAcetam  500 MG 24 hr tablet Commonly known as: KEPPRA  XR Take 4 tablets (2,000 mg total) by mouth  daily.   multivitamin-prenatal 27-0.8 MG Tabs tablet Take 1 tablet by mouth daily.        Joesph DELENA Sear, PA      [1]  Social History Tobacco Use   Smoking status: Never   Smokeless tobacco: Never  Vaping Use   Vaping status: Never Used  Substance Use Topics   Alcohol use: Not Currently   Drug use: Never  [2] No Known Allergies

## 2024-06-07 LAB — CULTURE, OB URINE

## 2024-06-09 ENCOUNTER — Inpatient Hospital Stay (HOSPITAL_COMMUNITY)
Admission: AD | Admit: 2024-06-09 | Discharge: 2024-06-09 | Disposition: A | Discharge status: Home / Self Care | Attending: Obstetrics & Gynecology | Admitting: Obstetrics & Gynecology

## 2024-06-09 ENCOUNTER — Encounter (HOSPITAL_COMMUNITY): Payer: Self-pay | Admitting: Obstetrics & Gynecology

## 2024-06-09 ENCOUNTER — Other Ambulatory Visit: Payer: Self-pay

## 2024-06-09 DIAGNOSIS — O99353 Diseases of the nervous system complicating pregnancy, third trimester: Secondary | ICD-10-CM | POA: Diagnosis not present

## 2024-06-09 DIAGNOSIS — F419 Anxiety disorder, unspecified: Secondary | ICD-10-CM | POA: Insufficient documentation

## 2024-06-09 DIAGNOSIS — G40909 Epilepsy, unspecified, not intractable, without status epilepticus: Secondary | ICD-10-CM | POA: Insufficient documentation

## 2024-06-09 DIAGNOSIS — O368130 Decreased fetal movements, third trimester, not applicable or unspecified: Secondary | ICD-10-CM | POA: Insufficient documentation

## 2024-06-09 DIAGNOSIS — O133 Gestational [pregnancy-induced] hypertension without significant proteinuria, third trimester: Secondary | ICD-10-CM | POA: Diagnosis present

## 2024-06-09 DIAGNOSIS — F4310 Post-traumatic stress disorder, unspecified: Secondary | ICD-10-CM | POA: Insufficient documentation

## 2024-06-09 DIAGNOSIS — O99343 Other mental disorders complicating pregnancy, third trimester: Secondary | ICD-10-CM | POA: Diagnosis not present

## 2024-06-09 DIAGNOSIS — F32A Depression, unspecified: Secondary | ICD-10-CM | POA: Diagnosis not present

## 2024-06-09 DIAGNOSIS — Z3A35 35 weeks gestation of pregnancy: Secondary | ICD-10-CM | POA: Insufficient documentation

## 2024-06-09 LAB — CBC
HCT: 31 % — ABNORMAL LOW (ref 36.0–46.0)
Hemoglobin: 10.1 g/dL — ABNORMAL LOW (ref 12.0–15.0)
MCH: 28.6 pg (ref 26.0–34.0)
MCHC: 32.6 g/dL (ref 30.0–36.0)
MCV: 87.8 fL (ref 80.0–100.0)
Platelets: 269 K/uL (ref 150–400)
RBC: 3.53 MIL/uL — ABNORMAL LOW (ref 3.87–5.11)
RDW: 13.8 % (ref 11.5–15.5)
WBC: 6.2 K/uL (ref 4.0–10.5)
nRBC: 0 % (ref 0.0–0.2)

## 2024-06-09 LAB — PROTEIN / CREATININE RATIO, URINE
Creatinine, Urine: 258 mg/dL
Protein Creatinine Ratio: 0.1 mg/mg
Total Protein, Urine: 32 mg/dL

## 2024-06-09 LAB — COMPREHENSIVE METABOLIC PANEL WITH GFR
ALT: 38 U/L (ref 0–44)
AST: 21 U/L (ref 15–41)
Albumin: 3.1 g/dL — ABNORMAL LOW (ref 3.5–5.0)
Alkaline Phosphatase: 203 U/L — ABNORMAL HIGH (ref 38–126)
Anion gap: 10 (ref 5–15)
BUN: 6 mg/dL (ref 6–20)
CO2: 23 mmol/L (ref 22–32)
Calcium: 9 mg/dL (ref 8.9–10.3)
Chloride: 104 mmol/L (ref 98–111)
Creatinine, Ser: 0.66 mg/dL (ref 0.44–1.00)
GFR, Estimated: >60 mL/min
Glucose, Bld: 113 mg/dL — ABNORMAL HIGH (ref 70–99)
Potassium: 3.7 mmol/L (ref 3.5–5.1)
Sodium: 136 mmol/L (ref 135–145)
Total Bilirubin: 0.2 mg/dL (ref 0.0–1.2)
Total Protein: 6.4 g/dL — ABNORMAL LOW (ref 6.5–8.1)

## 2024-06-09 MED ORDER — LABETALOL HCL 5 MG/ML IV SOLN: 20.0000 mg | INTRAVENOUS | Status: DC | PRN

## 2024-06-09 MED ORDER — LABETALOL HCL 5 MG/ML IV SOLN: 80.0000 mg | INTRAVENOUS | Status: DC | PRN

## 2024-06-09 MED ORDER — LABETALOL HCL 5 MG/ML IV SOLN: 40.0000 mg | INTRAVENOUS | Status: DC | PRN

## 2024-06-09 MED ORDER — HYDRALAZINE HCL 20 MG/ML IJ SOLN: 10.0000 mg | INTRAMUSCULAR | Status: DC | PRN

## 2024-06-09 NOTE — MAU Provider Note (Signed)
 History    Chief Complaint  Patient presents with   Headache   Angelica Holmes is 25 yo G1 @ [redacted]w[redacted]d presents with resolved HA and resolved decreased fetal movement. Pregnancy c/b gHTN, seizure disorder, anxiety and depression, and PTSD.   She reports episodic stress event around 1230 today that triggered a moderate throbbing headache with pain localized to right eye. Headache resolved without medication. She denies vision changes, headache,  vision changes, CP, SOB, abdominal pain and no epigastric pain. Denies VB, LOF and great fetal movement.     OB History     Gravida  1   Para      Term      Preterm      AB      Living         SAB      IAB      Ectopic      Multiple      Live Births              Past Medical History:  Diagnosis Date   Anxiety    Depression    Epilepsy (HCC)    PTSD (post-traumatic stress disorder)     History reviewed. No pertinent surgical history.  Family History  Problem Relation Age of Onset   ADD / ADHD Mother    Diabetes Mother    COPD Maternal Grandmother     Social History[1]  Allergies: Allergies[2]  Medications Prior to Admission  Medication Sig Dispense Refill Last Dose/Taking   albuterol  (VENTOLIN  HFA) 108 (90 Base) MCG/ACT inhaler Inhale 2 puffs into the lungs every 6 (six) hours as needed for wheezing or shortness of breath.   Past Month   aspirin  EC 81 MG tablet Take 1 tablet (81 mg total) by mouth daily. Start taking when you are [redacted] weeks pregnant for rest of pregnancy for prevention of preeclampsia 90 tablet 3 06/09/2024   Blood Glucose Monitoring Suppl (ACCU-CHEK GUIDE) w/Device KIT Use to test blood sugars in the morning, at noon, in the evening, and at bedtime. 1 kit 0 06/09/2024   glucose blood (ACCU-CHEK GUIDE TEST) test strip Use to test blood sugars 4 times daily as instructed. 100 each 12 06/09/2024   levETIRAcetam  (KEPPRA  XR) 500 MG 24 hr tablet Take 4 tablets (2,000 mg total) by mouth daily. 120 tablet 5 06/09/2024    Prenatal Vit-Fe Fumarate-FA (PRENATAL VITAMIN) 27-0.8 MG TABS Take 1 tablet by mouth daily. 30 tablet 11 06/09/2024   Accu-Chek Softclix Lancets lancets Use four times daily as instructed. 100 each 12    acetaminophen  (TYLENOL ) 500 MG tablet Take 2 tablets (1,000 mg total) by mouth every 6 (six) hours as needed for moderate pain (pain score 4-6). 30 tablet 0 Unknown   albuterol  (VENTOLIN  HFA) 108 (90 Base) MCG/ACT inhaler Inhale 2 puffs into the lungs every 6 (six) hours as needed for wheezing 18 g 5    levETIRAcetam  (KEPPRA  XR) 500 MG 24 hr tablet Take 2 tablets (1,000 mg total) by mouth 2 (two) times daily. 30 tablet 5     Review of Systems  Eyes:  Negative for visual disturbance.  Respiratory:  Negative for shortness of breath.   Cardiovascular:  Negative for chest pain.  Genitourinary:  Negative for vaginal bleeding.   Physical Exam Blood pressure (!) 117/54, pulse 90, temperature 98 F (36.7 C), temperature source Oral, resp. rate 17, height 5' 3 (1.6 m), weight (!) 149.9 kg, last menstrual period 09/14/2023, SpO2 98%. Physical Exam Vitals  reviewed.  Constitutional:      General: She is not in acute distress.    Appearance: She is obese.  Cardiovascular:     Rate and Rhythm: Normal rate and regular rhythm.     Pulses: Normal pulses.     Heart sounds: Normal heart sounds.  Pulmonary:     Effort: Pulmonary effort is normal.     Breath sounds: Normal breath sounds.  Skin:    General: Skin is warm and dry.  Neurological:     Mental Status: She is oriented to person, place, and time.     Deep Tendon Reflexes:     Reflex Scores:      Brachioradialis reflexes are 1+ on the right side and 1+ on the left side.      Patellar reflexes are 1+ on the right side and 1+ on the left side.    Comments: No clonus     MDM -gHTN  -nonsustained srBP on admission  -preE labs ordered  -asymptomatic for preE -not on meds    Assessment and Plan #gHTN -HA resolved on  admission -normotensive at discharge -preE labs reviewed and normal -Pt discharged home with preE precautions, keep appt on 1/19   #Decreased fetal movement -fetal movement active on admission -rNST -Reviewed fetal kick counts  Consult with Dr. Eveline complete and agrees with above plan  Berry Godsey G White, CNM      [1]  Social History Tobacco Use   Smoking status: Never   Smokeless tobacco: Never  Vaping Use   Vaping status: Never Used  Substance Use Topics   Alcohol use: Not Currently   Drug use: Never  [2] No Known Allergies

## 2024-06-09 NOTE — MAU Note (Signed)
 Angelica Holmes is a 24 y.o. at [redacted]w[redacted]d here in MAU reporting: DFM today-has tried laying on her side, drinking cold water, and eating food and still did not have normal fetal movement. Migraine started at 1230 today-took baby aspirin  with some relief but then came back. Denies any changes in her vision. Denies any RUQ pain, abnormal swelling, or sudden N/V.   Onset of complaint: 1230 Pain score: headache 6 Vitals:   06/09/24 1552  BP: (!) 164/81  Pulse: (!) 102  Resp: 17  Temp: 98 F (36.7 C)  SpO2: 98%     FHT:145 Lab orders placed from triage:  NST

## 2024-06-11 ENCOUNTER — Ambulatory Visit: Attending: Obstetrics and Gynecology | Admitting: *Deleted

## 2024-06-11 VITALS — BP 138/69

## 2024-06-11 DIAGNOSIS — Z3A35 35 weeks gestation of pregnancy: Secondary | ICD-10-CM | POA: Insufficient documentation

## 2024-06-11 DIAGNOSIS — O99213 Obesity complicating pregnancy, third trimester: Secondary | ICD-10-CM | POA: Insufficient documentation

## 2024-06-11 DIAGNOSIS — O24410 Gestational diabetes mellitus in pregnancy, diet controlled: Secondary | ICD-10-CM | POA: Diagnosis not present

## 2024-06-11 NOTE — Procedures (Signed)
 Angelica Holmes December 26, 2000 [redacted]w[redacted]d  Fetus A Non-Stress Test Interpretation for 06/11/24 (NST only)  Indication: Morbid obesity, GDM-diet  Fetal Heart Rate A Mode: External Baseline Rate (A): 130 bpm Variability: Moderate Accelerations: 15 x 15 Decelerations: None Multiple birth?: No  Uterine Activity Mode: Palpation, Toco Contraction Frequency (min): Occas w/UI Contraction Duration (sec): 10-40 Contraction Quality: Mild Resting Tone Palpated: Relaxed Resting Time: Adequate  Interpretation (Fetal Testing) Nonstress Test Interpretation: Reactive Comments: Dr. Arna reviewed tracing.

## 2024-06-14 ENCOUNTER — Encounter: Payer: Self-pay | Admitting: Family Medicine

## 2024-06-14 ENCOUNTER — Ambulatory Visit: Admitting: Family Medicine

## 2024-06-14 ENCOUNTER — Other Ambulatory Visit (HOSPITAL_COMMUNITY)
Admission: RE | Admit: 2024-06-14 | Discharge: 2024-06-14 | Disposition: A | Source: Ambulatory Visit | Attending: Family Medicine | Admitting: Family Medicine

## 2024-06-14 ENCOUNTER — Other Ambulatory Visit: Payer: Self-pay

## 2024-06-14 ENCOUNTER — Ambulatory Visit

## 2024-06-14 ENCOUNTER — Ambulatory Visit: Admitting: *Deleted

## 2024-06-14 VITALS — BP 138/88 | HR 114 | Wt 334.2 lb

## 2024-06-14 DIAGNOSIS — Z3A36 36 weeks gestation of pregnancy: Secondary | ICD-10-CM

## 2024-06-14 DIAGNOSIS — O24410 Gestational diabetes mellitus in pregnancy, diet controlled: Secondary | ICD-10-CM

## 2024-06-14 DIAGNOSIS — O0993 Supervision of high risk pregnancy, unspecified, third trimester: Secondary | ICD-10-CM

## 2024-06-14 DIAGNOSIS — O0990 Supervision of high risk pregnancy, unspecified, unspecified trimester: Secondary | ICD-10-CM | POA: Insufficient documentation

## 2024-06-14 DIAGNOSIS — O133 Gestational [pregnancy-induced] hypertension without significant proteinuria, third trimester: Secondary | ICD-10-CM

## 2024-06-14 DIAGNOSIS — F330 Major depressive disorder, recurrent, mild: Secondary | ICD-10-CM

## 2024-06-14 DIAGNOSIS — R569 Unspecified convulsions: Secondary | ICD-10-CM

## 2024-06-14 NOTE — Progress Notes (Signed)
" ° °  Subjective:  Angelica Holmes is a 24 y.o. G1P0 at [redacted]w[redacted]d being seen today for ongoing prenatal care.  She is currently monitored for the following issues for this high-risk pregnancy and has Recurrent depressive disorder, current episode mild; Anxiety disorder; Post traumatic stress disorder (PTSD); Supervision of high risk pregnancy, antepartum; BMI 50.0-59.9, adult (HCC); Seizures (HCC); Gestational hypertension; and Diet controlled gestational diabetes mellitus (GDM) in third trimester on their problem list.  Patient reports no complaints.  Contractions: Not present. Vag. Bleeding: None.  Movement: Present. Denies leaking of fluid.   The following portions of the patient's history were reviewed and updated as appropriate: allergies, current medications, past family history, past medical history, past social history, past surgical history and problem list. Problem list updated.  Objective:   Vitals:   06/14/24 1407  BP: 138/88  Pulse: (!) 114  Weight: (!) 334 lb 3 oz (151.6 kg)    Fetal Status: Fetal Heart Rate (bpm): 135   Movement: Present     General:  Alert, oriented and cooperative. Patient is in no acute distress.  Skin: Skin is warm and dry. No rash noted.   Cardiovascular: Normal heart rate noted  Respiratory: Normal respiratory effort, no problems with respiration noted  Abdomen: Soft, gravid, appropriate for gestational age. Pain/Pressure: Absent     Pelvic: Vag. Bleeding: None     Cervical exam deferred        Extremities: Normal range of motion.  Edema: None  Mental Status: Normal mood and affect. Normal behavior. Normal judgment and thought content.   Urinalysis:        Assessment and Plan:  Pregnancy: G1P0 at [redacted]w[redacted]d  1. Supervision of high risk pregnancy, antepartum (Primary) BP and FHR normal Self swabs per patient preference today - Culture, beta strep (group b only) - GC/Chlamydia probe amp ([facility])not at [facility]  2. Gestational hypertension, third  trimester Discussed PIH at length, inevitable progression to a worse state with time, and that it can be a life threatening condition for both mother and baby Mom with a lot to do and worried about getting induced next week. I recommended getting induced as soon as possible within the 37th week After discussion we settled on 06/22/2024 Form faxed, orders placed Weekly labs collected today NST done earlier this week and today both reactive, has twice weekly testing already scheduled for next week Discussed warning signs/symptoms that should prompt MAU visit  3. Diet controlled gestational diabetes mellitus (GDM) in third trimester See log, well controlled Last growth US  06/06/2024, EFW 74%, 2803g, AFI 19  4. Seizures (HCC) Last seizure in November Currently on Keppra  1g BID Last saw neurologist on 05/30/2024, continue current therapy with plan to drop back down to pre-pregnancy dose after delivery  5. Recurrent depressive disorder, current episode mild   Preterm labor symptoms and general obstetric precautions including but not limited to vaginal bleeding, contractions, leaking of fluid and fetal movement were reviewed in detail with the patient. Please refer to After Visit Summary for other counseling recommendations.  Return in 7 weeks (on 08/02/2024) for Dyad patient, PP check.   [name], MD "

## 2024-06-14 NOTE — Patient Instructions (Signed)

## 2024-06-15 LAB — COMPREHENSIVE METABOLIC PANEL WITH GFR
ALT: 51 IU/L — ABNORMAL HIGH (ref 0–32)
AST: 31 IU/L (ref 0–40)
Albumin: 3.5 g/dL — ABNORMAL LOW (ref 4.0–5.0)
Alkaline Phosphatase: 232 IU/L — ABNORMAL HIGH (ref 41–116)
BUN/Creatinine Ratio: 10 (ref 9–23)
BUN: 7 mg/dL (ref 6–20)
Bilirubin Total: <0.2 mg/dL (ref 0.0–1.2)
CO2: 17 mmol/L — ABNORMAL LOW (ref 20–29)
Calcium: 9.1 mg/dL (ref 8.7–10.2)
Chloride: 105 mmol/L (ref 96–106)
Creatinine, Ser: 0.7 mg/dL (ref 0.57–1.00)
Globulin, Total: 3 g/dL (ref 1.5–4.5)
Glucose: 95 mg/dL (ref 70–99)
Potassium: 4 mmol/L (ref 3.5–5.2)
Sodium: 137 mmol/L (ref 134–144)
Total Protein: 6.5 g/dL (ref 6.0–8.5)
eGFR: 126 mL/min/1.73

## 2024-06-15 LAB — PROTEIN / CREATININE RATIO, URINE
Creatinine, Urine: 352.2 mg/dL
Protein, Ur: 50.6 mg/dL
Protein/Creat Ratio: 144 mg/g{creat} (ref 0–200)

## 2024-06-15 LAB — CBC
Hematocrit: 33.2 % — ABNORMAL LOW (ref 34.0–46.6)
Hemoglobin: 11 g/dL — ABNORMAL LOW (ref 11.1–15.9)
MCH: 28.9 pg (ref 26.6–33.0)
MCHC: 33.1 g/dL (ref 31.5–35.7)
MCV: 87 fL (ref 79–97)
Platelets: 281 x10E3/uL (ref 150–450)
RBC: 3.8 x10E6/uL (ref 3.77–5.28)
RDW: 13.4 % (ref 11.7–15.4)
WBC: 6.4 x10E3/uL (ref 3.4–10.8)

## 2024-06-15 LAB — GC/CHLAMYDIA PROBE AMP (~~LOC~~) NOT AT ARMC
Chlamydia: NEGATIVE
Comment: NEGATIVE
Comment: NORMAL
Neisseria Gonorrhea: NEGATIVE

## 2024-06-18 ENCOUNTER — Telehealth (HOSPITAL_COMMUNITY): Payer: Self-pay | Admitting: *Deleted

## 2024-06-18 ENCOUNTER — Ambulatory Visit

## 2024-06-18 ENCOUNTER — Ambulatory Visit: Payer: Self-pay | Admitting: Family Medicine

## 2024-06-18 ENCOUNTER — Encounter (HOSPITAL_COMMUNITY): Payer: Self-pay | Admitting: *Deleted

## 2024-06-18 NOTE — Telephone Encounter (Signed)
 Preadmission screen

## 2024-06-19 ENCOUNTER — Other Ambulatory Visit

## 2024-06-19 ENCOUNTER — Encounter: Admitting: Family Medicine

## 2024-06-19 ENCOUNTER — Ambulatory Visit: Attending: Obstetrics and Gynecology

## 2024-06-19 ENCOUNTER — Other Ambulatory Visit: Payer: Self-pay

## 2024-06-19 VITALS — BP 147/91 | HR 88

## 2024-06-19 DIAGNOSIS — O24419 Gestational diabetes mellitus in pregnancy, unspecified control: Secondary | ICD-10-CM | POA: Diagnosis not present

## 2024-06-19 DIAGNOSIS — O133 Gestational [pregnancy-induced] hypertension without significant proteinuria, third trimester: Secondary | ICD-10-CM

## 2024-06-19 DIAGNOSIS — Z6841 Body Mass Index (BMI) 40.0 and over, adult: Secondary | ICD-10-CM

## 2024-06-19 DIAGNOSIS — Z3A36 36 weeks gestation of pregnancy: Secondary | ICD-10-CM | POA: Insufficient documentation

## 2024-06-19 LAB — CULTURE, BETA STREP (GROUP B ONLY): Strep Gp B Culture: NEGATIVE

## 2024-06-19 NOTE — Procedures (Signed)
 CATALEYAH COLBORN [birth year] [redacted]w[redacted]d  Fetus A Non-Stress Test Interpretation for 06/19/24  Indication: Chronic Hypertension and GDM - NST only  Fetal Heart Rate A Mode: External Baseline Rate (A): 140 bpm Variability: Moderate Accelerations: 15 x 15 Decelerations: None Multiple birth?: No  Uterine Activity Mode: Palpation, Toco Contraction Frequency (min): none noted Resting Tone Palpated: Relaxed  Interpretation (Fetal Testing) Nonstress Test Interpretation: Reactive Comments: Reviewed with Dr. Arna

## 2024-06-20 ENCOUNTER — Ambulatory Visit: Payer: Self-pay | Admitting: Family Medicine

## 2024-06-20 LAB — CBC
Hematocrit: 33.3 % — ABNORMAL LOW (ref 34.0–46.6)
Hemoglobin: 10.9 g/dL — ABNORMAL LOW (ref 11.1–15.9)
MCH: 29.1 pg (ref 26.6–33.0)
MCHC: 32.7 g/dL (ref 31.5–35.7)
MCV: 89 fL (ref 79–97)
Platelets: 255 10*3/uL (ref 150–450)
RBC: 3.74 x10E6/uL — ABNORMAL LOW (ref 3.77–5.28)
RDW: 14.4 % (ref 11.7–15.4)
WBC: 5.5 10*3/uL (ref 3.4–10.8)

## 2024-06-20 LAB — COMPREHENSIVE METABOLIC PANEL WITH GFR
ALT: 35 [IU]/L — ABNORMAL HIGH (ref 0–32)
AST: 24 [IU]/L (ref 0–40)
Albumin: 3.3 g/dL — ABNORMAL LOW (ref 4.0–5.0)
Alkaline Phosphatase: 241 [IU]/L — ABNORMAL HIGH (ref 41–116)
BUN/Creatinine Ratio: 7 — ABNORMAL LOW (ref 9–23)
BUN: 4 mg/dL — ABNORMAL LOW (ref 6–20)
Bilirubin Total: <0.2 mg/dL (ref 0.0–1.2)
CO2: 19 mmol/L — ABNORMAL LOW (ref 20–29)
Calcium: 8.8 mg/dL (ref 8.7–10.2)
Chloride: 106 mmol/L (ref 96–106)
Creatinine, Ser: 0.6 mg/dL (ref 0.57–1.00)
Globulin, Total: 2.7 g/dL (ref 1.5–4.5)
Glucose: 91 mg/dL (ref 70–99)
Potassium: 4.1 mmol/L (ref 3.5–5.2)
Sodium: 139 mmol/L (ref 134–144)
Total Protein: 6 g/dL (ref 6.0–8.5)
eGFR: 131 mL/min/{1.73_m2}

## 2024-06-21 ENCOUNTER — Encounter: Admitting: Family Medicine

## 2024-06-21 ENCOUNTER — Ambulatory Visit

## 2024-06-21 LAB — PROTEIN / CREATININE RATIO, URINE
Creatinine, Urine: 379.3 mg/dL
Protein, Ur: 74.2 mg/dL
Protein/Creat Ratio: 196 mg/g{creat} (ref 0–200)

## 2024-06-22 ENCOUNTER — Inpatient Hospital Stay (HOSPITAL_COMMUNITY)

## 2024-06-22 NOTE — H&P (Signed)
 OBSTETRIC ADMISSION HISTORY AND PHYSICAL  JANIQUA FRISCIA is a 24 y.o. female G1P0 with IUP at [redacted]w[redacted]d (dated by LMP, Estimated Date of Delivery: 07/11/24) presenting for IOL d/t gHTN.   She reports +FMs, No LOF, no VB, no blurry vision, headaches or peripheral edema, and RUQ pain.    She plans on breast feeding. She is undecided for birth control.  She received her prenatal care at [facility]   Prenatal History/Complications:   gHTN A1GDM Anxiety/PTSD  Past Medical History: Past Medical History:  Diagnosis Date   Anxiety    Depression    Epilepsy (HCC)    Gestational diabetes    Pregnancy induced hypertension    PTSD (post-traumatic stress disorder)     Past Surgical History: No past surgical history on file.  Obstetrical History: OB History     Gravida  1   Para      Term      Preterm      AB      Living         SAB      IAB      Ectopic      Multiple      Live Births              Social History Social History   Socioeconomic History   Marital status: Single    Spouse name: Not on file   Number of children: Not on file   Years of education: Not on file   Highest education level: GED or equivalent  Occupational History   Not on file  Tobacco Use   Smoking status: Never   Smokeless tobacco: Never  Vaping Use   Vaping status: Never Used  Substance and Sexual Activity   Alcohol use: Not Currently   Drug use: Never   Sexual activity: Yes    Birth control/protection: None  Other Topics Concern   Not on file  Social History Narrative   Not on file   Social Drivers of Health   Tobacco Use: Low Risk (06/18/2024)   Patient History    Smoking Tobacco Use: Never    Smokeless Tobacco Use: Never    Passive Exposure: Not on file  Financial Resource Strain: High Risk (09/28/2022)   Overall Financial Resource Strain (CARDIA)    Difficulty of Paying Living Expenses: Hard  Food Insecurity: No Food Insecurity (03/18/2024)   Epic    Worried About  Programme Researcher, Broadcasting/film/video in the Last Year: Never true    Ran Out of Food in the Last Year: Never true  Recent Concern: Food Insecurity - Medium Risk (01/08/2024)   Received from [facility]   Epic    Within the past 12 months, you worried that your food would run out before you got money to buy more: Never true    Within the past 12 months, the food you bought just didn't last and you didn't have money to get more. : Sometimes true  Transportation Needs: No Transportation Needs (03/18/2024)   Epic    Lack of Transportation (Medical): No    Lack of Transportation (Non-Medical): No  Physical Activity: Insufficiently Active (09/28/2022)   Exercise Vital Sign    Days of Exercise per Week: 1 day    Minutes of Exercise per Session: 20 min  Stress: Stress Concern Present (09/28/2022)   [employer] of Occupational Health - Occupational Stress Questionnaire    Feeling of Stress : Very much  Social Connections: Socially Isolated (09/28/2022)  Social Advertising Account Executive    Frequency of Communication with Friends and Family: Twice a week    Frequency of Social Gatherings with Friends and Family: Never    Attends Religious Services: Never    Database Administrator or Organizations: No    Attends Engineer, Structural: Not on file    Marital Status: Living with partner  Depression (PHQ2-9): Medium Risk (12/26/2023)   Depression (PHQ2-9)    PHQ-2 Score: 7  Alcohol Screen: Not on file  Housing: Low Risk (03/18/2024)   Epic    Unable to Pay for Housing in the Last Year: No    Number of Times Moved in the Last Year: 0    Homeless in the Last Year: No  Utilities: Not At Risk (03/18/2024)   Epic    Threatened with loss of utilities: No  Health Literacy: Not on file    Family History: Family History  Problem Relation Age of Onset   ADD / ADHD Mother    Diabetes Mother    COPD Maternal Grandmother     Allergies: Allergies[1]  No medications prior to admission.      Review of Systems  All systems reviewed and negative except as stated in HPI.  Last menstrual period 09/14/2023. General appearance: alert, cooperative, and appears stated age Lungs: breathing comfortably on room air Heart: regular rate Abdomen: soft, non-tender; gravid Extremities: no edema of bilateral lower extremities DTR's intact Presentation: cephalic Fetal monitoringBaseline: 150 bpm, Variability: Good {> 6 bpm), Accelerations: Reactive, and Decelerations: Absent Uterine activityNone     Prenatal labs: ABO, Rh: O/Positive/-- (08/04 1600) Antibody: Negative (08/04 1600) Rubella: 2.95 (08/04 1600) RPR: Non Reactive (12/03 0920)  HBsAg: Negative (08/04 1600)  HIV: Non Reactive (12/03 0920)  GBS: Negative/-- (01/22 1558)  2 hr Glucola abnormal Genetic screening  LR XX Anatomy US  Appears normal Last US : At [redacted]w[redacted]d - cephalic presentation, EFW 2803g (74 %tile), AC 86%  Prenatal Transfer Tool  Maternal Diabetes: Yes:  Diabetes Type:  Diet controlled Genetic Screening: Normal Maternal Ultrasounds/Referrals: Normal Fetal Ultrasounds or other Referrals:  None Maternal Substance Abuse:  No Significant Maternal Medications:  None Significant Maternal Lab Results:  Group B Strep negative Number of Prenatal Visits:greater than 3 verified prenatal visits Other Comments:  None  No results found for this or any previous visit (from the past 24 hours).  Patient Active Problem List   Diagnosis Date Noted   Diet controlled gestational diabetes mellitus (GDM) in third trimester 06/05/2024   Gestational hypertension 05/07/2024   BMI 50.0-59.9, adult (HCC) 12/27/2023   Seizures (HCC) 12/27/2023   Supervision of high risk pregnancy, antepartum 11/29/2023   Recurrent depressive disorder, current episode mild 04/29/2019   Anxiety disorder 03/13/2019   Post traumatic stress disorder (PTSD) 03/13/2019    Assessment/Plan:  ALLIX BLOMQUIST is a 24 y.o. G1P0 at [redacted]w[redacted]d here for  IOL due to gHTN  #Labor: IOL process explained in detail. Will start with dual cytotec . #Pain: Per patient request #FWB: Cat I #ID:  GBS (-) #MOF: Breast #MOC: Undecided, discussed options with patient.  #Circ:  N/A #gHTN: PIH labs pending #A1GDM: q4h CBG's in latent labor, q2h in active labor.   Barkley Angles, MD OB Fellow, Faculty [facility], [facility] 06/22/2024 11:33 PM         [1] No Known Allergies

## 2024-06-23 ENCOUNTER — Encounter (HOSPITAL_COMMUNITY): Payer: Self-pay | Admitting: Family Medicine

## 2024-06-23 ENCOUNTER — Inpatient Hospital Stay (HOSPITAL_COMMUNITY)

## 2024-06-23 ENCOUNTER — Other Ambulatory Visit: Payer: Self-pay

## 2024-06-23 ENCOUNTER — Inpatient Hospital Stay (HOSPITAL_COMMUNITY)
Admission: AD | Admit: 2024-06-23 | Discharge: 2024-06-28 | Disposition: A | Payer: Self-pay | Source: Home / Self Care | Attending: Family Medicine | Admitting: Family Medicine

## 2024-06-23 DIAGNOSIS — O24410 Gestational diabetes mellitus in pregnancy, diet controlled: Secondary | ICD-10-CM | POA: Diagnosis present

## 2024-06-23 DIAGNOSIS — Z6841 Body Mass Index (BMI) 40.0 and over, adult: Secondary | ICD-10-CM

## 2024-06-23 DIAGNOSIS — F419 Anxiety disorder, unspecified: Secondary | ICD-10-CM | POA: Diagnosis present

## 2024-06-23 DIAGNOSIS — R569 Unspecified convulsions: Secondary | ICD-10-CM

## 2024-06-23 DIAGNOSIS — O139 Gestational [pregnancy-induced] hypertension without significant proteinuria, unspecified trimester: Principal | ICD-10-CM | POA: Diagnosis present

## 2024-06-23 DIAGNOSIS — O133 Gestational [pregnancy-induced] hypertension without significant proteinuria, third trimester: Secondary | ICD-10-CM

## 2024-06-23 LAB — COMPREHENSIVE METABOLIC PANEL WITH GFR
ALT: 24 U/L (ref 0–44)
AST: 20 U/L (ref 15–41)
Albumin: 3.1 g/dL — ABNORMAL LOW (ref 3.5–5.0)
Alkaline Phosphatase: 222 U/L — ABNORMAL HIGH (ref 38–126)
Anion gap: 11 (ref 5–15)
BUN: <5 mg/dL — ABNORMAL LOW (ref 6–20)
CO2: 23 mmol/L (ref 22–32)
Calcium: 9.1 mg/dL (ref 8.9–10.3)
Chloride: 105 mmol/L (ref 98–111)
Creatinine, Ser: 0.73 mg/dL (ref 0.44–1.00)
GFR, Estimated: >60 mL/min
Glucose, Bld: 96 mg/dL (ref 70–99)
Potassium: 3.6 mmol/L (ref 3.5–5.1)
Sodium: 139 mmol/L (ref 135–145)
Total Bilirubin: 0.2 mg/dL (ref 0.0–1.2)
Total Protein: 6.4 g/dL — ABNORMAL LOW (ref 6.5–8.1)

## 2024-06-23 LAB — CBC
HCT: 31 % — ABNORMAL LOW (ref 36.0–46.0)
Hemoglobin: 10.1 g/dL — ABNORMAL LOW (ref 12.0–15.0)
MCH: 29.4 pg (ref 26.0–34.0)
MCHC: 32.6 g/dL (ref 30.0–36.0)
MCV: 90.1 fL (ref 80.0–100.0)
Platelets: 234 10*3/uL (ref 150–400)
RBC: 3.44 MIL/uL — ABNORMAL LOW (ref 3.87–5.11)
RDW: 15 % (ref 11.5–15.5)
WBC: 7.2 10*3/uL (ref 4.0–10.5)
nRBC: 0 % (ref 0.0–0.2)

## 2024-06-23 LAB — GLUCOSE, CAPILLARY
Glucose-Capillary: 92 mg/dL (ref 70–99)
Glucose-Capillary: 96 mg/dL (ref 70–99)
Glucose-Capillary: 90 mg/dL (ref 70–99)
Glucose-Capillary: 130 mg/dL — ABNORMAL HIGH (ref 70–99)
Glucose-Capillary: 100 mg/dL — ABNORMAL HIGH (ref 70–99)

## 2024-06-23 LAB — PROTEIN / CREATININE RATIO, URINE
Creatinine, Urine: 551 mg/dL
Protein Creatinine Ratio: 0.1 mg/mg
Total Protein, Urine: 78 mg/dL

## 2024-06-23 LAB — SYPHILIS: RPR W/REFLEX TO RPR TITER AND TREPONEMAL ANTIBODIES, TRADITIONAL SCREENING AND DIAGNOSIS ALGORITHM: RPR Ser Ql: NONREACTIVE

## 2024-06-23 MED ORDER — LACTATED RINGERS IV SOLN: INTRAVENOUS | Status: DC

## 2024-06-23 MED ORDER — SOD CITRATE-CITRIC ACID 500-334 MG/5ML PO SOLN: 30.0000 mL | ORAL | Status: DC | PRN

## 2024-06-23 MED ORDER — FENTANYL CITRATE (PF) 100 MCG/2ML IJ SOLN: 100.0000 ug | INTRAMUSCULAR | Status: DC | PRN

## 2024-06-23 MED ORDER — MISOPROSTOL 25 MCG QUARTER TABLET
25.0000 ug | ORAL_TABLET | Freq: Once | ORAL | Status: AC
  Administered 2024-06-23: 25 ug via VAGINAL

## 2024-06-23 MED ORDER — MISOPROSTOL 50MCG HALF TABLET
50.0000 ug | ORAL_TABLET | Freq: Once | ORAL | Status: DC
  Filled 2024-06-23: qty 1

## 2024-06-23 MED ORDER — ACETAMINOPHEN 325 MG PO TABS: 650.0000 mg | ORAL_TABLET | ORAL | Status: DC | PRN

## 2024-06-23 MED ORDER — OXYTOCIN BOLUS FROM INFUSION
333.0000 mL | Freq: Once | INTRAVENOUS | Status: AC
  Administered 2024-06-26: 333 mL via INTRAVENOUS

## 2024-06-23 MED ORDER — MISOPROSTOL 25 MCG QUARTER TABLET
25.0000 ug | ORAL_TABLET | ORAL | Status: DC
  Administered 2024-06-23: 25 ug via VAGINAL
  Filled 2024-06-23: qty 1

## 2024-06-23 MED ORDER — ONDANSETRON HCL 4 MG/2ML IJ SOLN
4.0000 mg | Freq: Four times a day (QID) | INTRAMUSCULAR | Status: DC | PRN
  Administered 2024-06-26: 4 mg via INTRAVENOUS
  Filled 2024-06-23: qty 2

## 2024-06-23 MED ORDER — LACTATED RINGERS IV SOLN: 500.0000 mL | INTRAVENOUS | Status: DC | PRN

## 2024-06-23 MED ORDER — LIDOCAINE HCL (PF) 1 % IJ SOLN: 30.0000 mL | INTRAMUSCULAR | Status: DC | PRN

## 2024-06-23 MED ORDER — OXYTOCIN-SODIUM CHLORIDE 30-0.9 UT/500ML-% IV SOLN
2.5000 [IU]/h | INTRAVENOUS | Status: DC
  Filled 2024-06-23: qty 500

## 2024-06-23 MED ORDER — MISOPROSTOL 50MCG HALF TABLET
50.0000 ug | ORAL_TABLET | Freq: Once | ORAL | Status: AC
  Administered 2024-06-23: 50 ug via ORAL

## 2024-06-23 MED ORDER — LEVETIRACETAM ER 500 MG PO TB24
2000.0000 mg | ORAL_TABLET | Freq: Every day | ORAL | Status: DC
  Administered 2024-06-23 – 2024-06-28 (×6): 2000 mg via ORAL
  Filled 2024-06-23 (×9): qty 4

## 2024-06-23 MED ORDER — TERBUTALINE SULFATE 1 MG/ML IJ SOLN: 0.2500 mg | Freq: Once | INTRAMUSCULAR | Status: DC | PRN

## 2024-06-23 MED ORDER — FENTANYL CITRATE (PF) 100 MCG/2ML IJ SOLN
50.0000 ug | INTRAMUSCULAR | Status: DC | PRN
  Administered 2024-06-24: 50 ug via INTRAVENOUS
  Administered 2024-06-25: 100 ug via INTRAVENOUS
  Filled 2024-06-23 (×3): qty 2

## 2024-06-23 MED ORDER — MISOPROSTOL 50MCG HALF TABLET
50.0000 ug | ORAL_TABLET | ORAL | Status: DC
  Administered 2024-06-23 – 2024-06-24 (×3): 50 ug via ORAL
  Administered 2024-06-24: 25 ug via ORAL
  Filled 2024-06-23 (×2): qty 1

## 2024-06-23 NOTE — Progress Notes (Signed)
 Labor Progress Note Angelica Holmes is a 24 y.o. G1P0 at [redacted]w[redacted]d    BP (!) 148/87   Pulse 100   Temp 98 F (36.7 C) (Oral)   Resp 16   Ht 5' 3 (1.6 m)   Wt (!) 156.4 kg   LMP 09/14/2023 (Exact Date)   BMI 61.10 kg/m   EFM: 130/Moderate Variability/Accelerations (+),Decelerations (-)  CVE: Dilation: Closed Effacement (%): Thick Station: -3 Presentation: Vertex Exam by:: Eliav Mechling, MD  Patient doing well, resting comfortably. Cervical exam remains unchanged. Placed 25mcg Cytotec  vaginally.   #gHTN: BP's reviewed. PIH labs normal.   #A1GDM: CBG's reviewed, continue q4h CBG checks in latent labor.     [name], MD 6:26 AM

## 2024-06-23 NOTE — Progress Notes (Signed)
 Labor Progress Note Angelica Holmes is a 24 y.o. G1P0 at [redacted]w[redacted]d presented for IOL due to gHTN S:   O:  BP 132/70   Pulse 96   Temp 98 F (36.7 C) (Oral)   Resp 16   Ht 5' 3 (1.6 m)   Wt (!) 156.4 kg   LMP 09/14/2023 (Exact Date)   BMI 61.10 kg/m  EFM: 140bpm/good variability/accels present/ no decels  CVE: Dilation: Closed Effacement (%): Thick Station: -3 Presentation: Vertex (confirmed with US ) Exam by:: Olam Rollie Fuelling CNM   A&P: 24 y.o. G1P0 [redacted]w[redacted]d for IOL due to gHTN #Labor: IOL for gHTN - Vaginal cytotec  not dissolved. Will trial oral misoprostol .  - Discussed FB placement, patient declines at this time - Recheck in 4 hours  #Pain: per patient request #FWB: Category I tracing - continue monitoring #GBS negative  #gHTN Negative labs.  - Watch BPs  #A1GDM  q4h CBG's in latent labor, q2h in active labor.   #Seizure disorder  - Continue home Keppra    Garen [name], MD, PGY-1 11:07 AM

## 2024-06-23 NOTE — Progress Notes (Signed)
 Labor Progress Note Angelica Holmes is a 24 y.o. G1P0 at [redacted]w[redacted]d presented for IOL due to gHTN S: feeling some cramps. Sleepy.   O:  BP 132/70   Pulse 96   Temp 98 F (36.7 C) (Oral)   Resp 16   Ht 5' 3 (1.6 m)   Wt (!) 156.4 kg   LMP 09/14/2023 (Exact Date)   BMI 61.10 kg/m  EFM: 140bpm/good variability/accels present/ no decels  CVE: Dilation: Closed Effacement (%): Thick Station: -3 Presentation: Vertex (confirmed with US ) Exam by:: Olam Rollie Fuelling CNM   A&P: 24 y.o. G1P0 [redacted]w[redacted]d for IOL due to gHTN #Labor: IOL for gHTN S/p dual cytotec , will trial other dose of oral cytotec . Discussed FB placement, patient declines at this time - Recheck in 4 hours  #Pain: per patient request #FWB: Category I tracing - continue monitoring #GBS negative  #gHTN Negative labs.  - Watch BPs  #A1GDM  - q4h CBG's in latent labor, q2h in active labor.   #Seizure disorder  - Continue home Keppra    Garen [name], MD, PGY-1 2:49 PM

## 2024-06-23 NOTE — Progress Notes (Signed)
 Labor Progress Note Angelica Holmes is a 24 y.o. G1P0 at [redacted]w[redacted]d    BP (!) 150/91   Pulse 95   Temp 98 F (36.7 C) (Oral)   Resp 18   Ht 5' 3 (1.6 m)   Wt (!) 156.4 kg   LMP 09/14/2023 (Exact Date)   BMI 61.10 kg/m   EFM: 135/Moderate Variability/Accelerations (+),Decelerations (-)  CVE: Dilation: Fingertip Effacement (%): Thick Station: -4 (Ballotable) Presentation: Vertex (by bedside US ) Exam by:: Garen Puffer MD  Patient eating and wanted to shower. Confirmed presentation with US  and dose of PO cytotec  given. Will evaluate for FB next check.   #gHTN:  BP's reviewed, continues to have mild range blood pressures.  #A1GDM: CBG's reviewed, continue q4h CBG checks in latent labor.   Angelica Recktenwald LITTIE Angles, MD 10:50 PM

## 2024-06-24 LAB — CBC
HCT: 33 % — ABNORMAL LOW (ref 36.0–46.0)
HCT: 35.6 % — ABNORMAL LOW (ref 36.0–46.0)
Hemoglobin: 11.2 g/dL — ABNORMAL LOW (ref 12.0–15.0)
Hemoglobin: 11.6 g/dL — ABNORMAL LOW (ref 12.0–15.0)
MCH: 29.7 pg (ref 26.0–34.0)
MCH: 28.7 pg (ref 26.0–34.0)
MCHC: 33.9 g/dL (ref 30.0–36.0)
MCHC: 32.6 g/dL (ref 30.0–36.0)
MCV: 87.5 fL (ref 80.0–100.0)
MCV: 88.1 fL (ref 80.0–100.0)
Platelets: 234 10*3/uL (ref 150–400)
Platelets: 252 10*3/uL (ref 150–400)
RBC: 3.77 MIL/uL — ABNORMAL LOW (ref 3.87–5.11)
RBC: 4.04 MIL/uL (ref 3.87–5.11)
RDW: 14.9 % (ref 11.5–15.5)
RDW: 14.9 % (ref 11.5–15.5)
WBC: 6.7 10*3/uL (ref 4.0–10.5)
WBC: 6.9 10*3/uL (ref 4.0–10.5)
nRBC: 0 % (ref 0.0–0.2)
nRBC: 0 % (ref 0.0–0.2)

## 2024-06-24 LAB — GLUCOSE, CAPILLARY
Glucose-Capillary: 113 mg/dL — ABNORMAL HIGH (ref 70–99)
Glucose-Capillary: 116 mg/dL — ABNORMAL HIGH (ref 70–99)
Glucose-Capillary: 119 mg/dL — ABNORMAL HIGH (ref 70–99)
Glucose-Capillary: 95 mg/dL (ref 70–99)
Glucose-Capillary: 95 mg/dL (ref 70–99)

## 2024-06-24 LAB — COMPREHENSIVE METABOLIC PANEL WITH GFR
ALT: 20 U/L (ref 0–44)
AST: 19 U/L (ref 15–41)
Albumin: 3 g/dL — ABNORMAL LOW (ref 3.5–5.0)
Alkaline Phosphatase: 230 U/L — ABNORMAL HIGH (ref 38–126)
Anion gap: 13 (ref 5–15)
BUN: <5 mg/dL — ABNORMAL LOW (ref 6–20)
CO2: 19 mmol/L — ABNORMAL LOW (ref 22–32)
Calcium: 9 mg/dL (ref 8.9–10.3)
Chloride: 106 mmol/L (ref 98–111)
Creatinine, Ser: 0.61 mg/dL (ref 0.44–1.00)
GFR, Estimated: >60 mL/min
Glucose, Bld: 99 mg/dL (ref 70–99)
Potassium: 3.9 mmol/L (ref 3.5–5.1)
Sodium: 139 mmol/L (ref 135–145)
Total Bilirubin: 0.3 mg/dL (ref 0.0–1.2)
Total Protein: 6.4 g/dL — ABNORMAL LOW (ref 6.5–8.1)

## 2024-06-24 MED ORDER — TERBUTALINE SULFATE 1 MG/ML IJ SOLN: 0.2500 mg | Freq: Once | INTRAMUSCULAR | Status: DC | PRN

## 2024-06-24 MED ORDER — MISOPROSTOL 25 MCG QUARTER TABLET
25.0000 ug | ORAL_TABLET | Freq: Once | ORAL | Status: DC
  Filled 2024-06-24: qty 1

## 2024-06-24 MED ORDER — OXYTOCIN-SODIUM CHLORIDE 30-0.9 UT/500ML-% IV SOLN
1.0000 m[IU]/min | INTRAVENOUS | Status: DC
  Administered 2024-06-24 – 2024-06-25 (×2): 2 m[IU]/min via INTRAVENOUS
  Filled 2024-06-24 (×2): qty 500

## 2024-06-24 NOTE — Progress Notes (Signed)
 Labor Progress Note Angelica Holmes is a 24 y.o. G1P0 at [redacted]w[redacted]d presented for IOL 2/2 GHTN  S:  Coping well, fentanyl  offered prior to FB placement.   O:  BP 124/63   Pulse 84   Temp 98.3 F (36.8 C) (Oral)   Resp 16   Ht 5' 3 (1.6 m)   Wt (!) 156.4 kg   LMP 09/14/2023 (Exact Date)   BMI 61.10 kg/m   EFM: baseline 135 bpm/ minimal to moderate variability/ 15x15 accels/ absent decels  Toco/IUPC: unable to determine, irregular per patient.  SVE: Dilation: Fingertip Effacement (%): Thick Station: -4 (Ballotable) Presentation: Vertex Exam by:: Cay, RN Pitocin : 0 mu/min  A/P: 24 y.o. G1P0 [redacted]w[redacted]d  1. Labor: IOL in latent phase s/p 6 doses cytotec . RBA of CRB d/w patient, with patient desiring intervention. Cook's used, uterine balloon only filled with 60mL of sterile fluid. Patient tolerated well.  2. FWB: Cat 2, reassuring overall.  3. Pain: Coping well, tolerated placement of CRB after 1 dose fentanyl .  4. GBS negative 5. Mild range BPs   Working toward SVB.  Camie [name], CNM 8:49 AM

## 2024-06-24 NOTE — Progress Notes (Signed)
 Labor Progress Note Angelica Holmes is a 24 y.o. G1P0 at [redacted]w[redacted]d presented for IOL 2/2 GHTN  S:  Coping well.   O:  BP 139/68   Pulse 92   Temp 97.6 F (36.4 C) (Axillary)   Resp 16   Ht 5' 3 (1.6 m)   Wt (!) 156.4 kg   LMP 09/14/2023 (Exact Date)   BMI 61.10 kg/m   EFM: baseline 130 bpm/ moderate variability/ 15x15 accels/ absent decels  Toco/IUPC: UTD SVE: Dilation: 3 Effacement (%): Thick Station: -3, -4 (Ballotable) Presentation: Vertex Exam by:: [name], CNM Pitocin : 10 mu/min  A/P: 24 y.o. G1P0 [redacted]w[redacted]d  1. Labor: IOL in latent phase s/p multiple cytotec  doses and CRB. Dual management with CRB and pitocin  currently. Able to pull FB with traction. Fetal head too high to safely perform AROM, continue to titrate pit, ambulate and consider AROM in a few hours.  2. FWB: Cat 1 3. Pain: Coping well, epidural or medication on request.  4. GDM - diet controlled. CBGs appropriate - q4h in latent labor.   Working toward SVB.   Camie [name], CNM 8:58 PM

## 2024-06-24 NOTE — Progress Notes (Signed)
 Patient ID: Angelica Holmes, female   DOB: Feb 21, 2001, 24 y.o.   MRN: [id number]  Blood pressure (!) 135/93, pulse 100, temperature 98.6 F (37 C), temperature source Oral, resp. rate 16, height 5' 3 (1.6 m), weight (!) 156.4 kg, last menstrual period 09/14/2023.  FHT: baseline 150/moderate variability/+accels/-decels Toco: not measuring ctx  Dilation: Fingertip Effacement (%): Thick Station: -3 Presentation: Vertex Exam by:: [name] CNM  Seen at bedside, patient laying in chair. Foley balloon still in place, on pitocin  at 10 mu/min. Feeling contractions q32min per patient, rates them a 5/10 on pain scale. Discussed plan of care. Continues on keppra  for seizures. POT glucose checks 113,119, on clear liquid diet. All questions answered at bedside.  Charlie DELENA Courts, MD

## 2024-06-25 ENCOUNTER — Inpatient Hospital Stay (HOSPITAL_COMMUNITY): Admitting: Anesthesiology

## 2024-06-25 LAB — CBC
HCT: 32.9 % — ABNORMAL LOW (ref 36.0–46.0)
Hemoglobin: 10.8 g/dL — ABNORMAL LOW (ref 12.0–15.0)
MCH: 28.9 pg (ref 26.0–34.0)
MCHC: 32.8 g/dL (ref 30.0–36.0)
MCV: 88 fL (ref 80.0–100.0)
Platelets: 240 10*3/uL (ref 150–400)
RBC: 3.74 MIL/uL — ABNORMAL LOW (ref 3.87–5.11)
RDW: 15.1 % (ref 11.5–15.5)
WBC: 7.2 10*3/uL (ref 4.0–10.5)
nRBC: 0 % (ref 0.0–0.2)

## 2024-06-25 LAB — GLUCOSE, CAPILLARY
Glucose-Capillary: 101 mg/dL — ABNORMAL HIGH (ref 70–99)
Glucose-Capillary: 101 mg/dL — ABNORMAL HIGH (ref 70–99)
Glucose-Capillary: 94 mg/dL (ref 70–99)
Glucose-Capillary: 86 mg/dL (ref 70–99)
Glucose-Capillary: 91 mg/dL (ref 70–99)

## 2024-06-25 MED ORDER — LIDOCAINE HCL (PF) 1 % IJ SOLN
INTRAMUSCULAR | Status: DC | PRN
  Administered 2024-06-25 (×2): 4 mL via EPIDURAL

## 2024-06-25 MED ORDER — LACTATED RINGERS IV SOLN: 500.0000 mL | Freq: Once | INTRAVENOUS | Status: DC

## 2024-06-25 MED ORDER — EPHEDRINE 5 MG/ML INJ: 10.0000 mg | INTRAVENOUS | Status: DC | PRN

## 2024-06-25 MED ORDER — PHENYLEPHRINE 80 MCG/ML (10ML) SYRINGE FOR IV PUSH (FOR BLOOD PRESSURE SUPPORT): 80.0000 ug | PREFILLED_SYRINGE | INTRAVENOUS | Status: DC | PRN

## 2024-06-25 MED ORDER — DIPHENHYDRAMINE HCL 50 MG/ML IJ SOLN: 12.5000 mg | INTRAMUSCULAR | Status: DC | PRN

## 2024-06-25 MED ORDER — CALCIUM CARBONATE ANTACID 500 MG PO CHEW
2.0000 | CHEWABLE_TABLET | Freq: Once | ORAL | Status: AC
  Administered 2024-06-25: 400 mg via ORAL
  Filled 2024-06-25: qty 2

## 2024-06-25 MED ORDER — FENTANYL-BUPIVACAINE-NACL 0.5-0.125-0.9 MG/250ML-% EP SOLN
12.0000 mL/h | EPIDURAL | Status: DC | PRN
  Administered 2024-06-25: 12 mL/h via EPIDURAL
  Filled 2024-06-25 (×2): qty 250

## 2024-06-25 NOTE — Anesthesia Procedure Notes (Signed)
 Epidural Patient location during procedure: OB Start time: 06/25/2024 4:46 AM End time: 06/25/2024 4:51 AM  Staffing Anesthesiologist: Peggye Delon Brunswick, MD Performed: anesthesiologist   Preanesthetic Checklist Completed: patient identified, IV checked, risks and benefits discussed, monitors and equipment checked, pre-op evaluation and timeout performed  Epidural Patient position: sitting Prep: DuraPrep and site prepped and draped Patient monitoring: continuous pulse ox and blood pressure Approach: midline Location: L3-L4 Injection technique: LOR saline  Needle:  Needle type: Tuohy  Needle gauge: 17 G Needle length: 9 cm and 9 Needle insertion depth: 9 cm Catheter type: closed end flexible Catheter size: 19 Gauge Catheter at skin depth: 14 cm Test dose: negative  Assessment Events: blood not aspirated, no cerebrospinal fluid, injection not painful, no injection resistance, no paresthesia and negative IV test  Additional Notes The patient has requested an epidural for labor pain management. Risks and benefits including, but not limited to, infection, bleeding, local anesthetic toxicity, headache, hypotension, back pain, block failure, etc. were discussed with the patient. The patient expressed understanding and consented to the procedure. I confirmed that the patient has no bleeding disorders and is not taking blood thinners. I confirmed the patient's last platelet count with the nurse. A time-out was performed immediately prior to the procedure. Please see nursing documentation for vital signs. Sterile technique was used throughout the whole procedure. Once LOR achieved, the epidural catheter threaded easily without resistance. Aspiration of the catheter was negative for blood and CSF. The epidural was dosed slowly and an infusion was started.  1 attempt(s)Reason for block:procedure for pain

## 2024-06-25 NOTE — Progress Notes (Signed)
 Patient ID: HYDIA COPELIN, female   DOB: May 18, 2001, 24 y.o.   MRN: [id number]  Blood pressure 120/73, pulse 90, temperature 98.6 F (37 C), resp. rate 18, height 5' 3 (1.6 m), weight (!) 156.4 kg, last menstrual period 09/14/2023, SpO2 100%.  FHT: baseline 135/moderate variability/+accels/-decels IUPC: ctx q77min  Dilation: 4 Effacement (%): 70 Station: -1 Presentation: Vertex Exam by:: Sar  Seen at bedside to meet patient. Comfortable with epidural, RN doing position changes. SVE completed, unchanged. Will continue with pitocin  for augmentation, currently at 14 mu/min. IUPC in place, MVUs ~70, inadequate. POCT glucose check 94 at 1012am. Blood pressures overall mild range, one non-sustained severe at 550am.  Charlie DELENA Courts, MD

## 2024-06-26 ENCOUNTER — Encounter (HOSPITAL_COMMUNITY): Payer: Self-pay | Admitting: Family Medicine

## 2024-06-26 ENCOUNTER — Encounter: Admitting: Family Medicine

## 2024-06-26 LAB — CBC
HCT: 30.6 % — ABNORMAL LOW (ref 36.0–46.0)
HCT: 34.1 % — ABNORMAL LOW (ref 36.0–46.0)
Hemoglobin: 10.2 g/dL — ABNORMAL LOW (ref 12.0–15.0)
Hemoglobin: 11.5 g/dL — ABNORMAL LOW (ref 12.0–15.0)
MCH: 29.6 pg (ref 26.0–34.0)
MCH: 29.7 pg (ref 26.0–34.0)
MCHC: 33.3 g/dL (ref 30.0–36.0)
MCHC: 33.7 g/dL (ref 30.0–36.0)
MCV: 88.7 fL (ref 80.0–100.0)
MCV: 88.1 fL (ref 80.0–100.0)
Platelets: 204 10*3/uL (ref 150–400)
Platelets: 211 10*3/uL (ref 150–400)
RBC: 3.45 MIL/uL — ABNORMAL LOW (ref 3.87–5.11)
RBC: 3.87 MIL/uL (ref 3.87–5.11)
RDW: 15.3 % (ref 11.5–15.5)
RDW: 15 % (ref 11.5–15.5)
WBC: 12.5 10*3/uL — ABNORMAL HIGH (ref 4.0–10.5)
WBC: 17.6 10*3/uL — ABNORMAL HIGH (ref 4.0–10.5)
nRBC: 0 % (ref 0.0–0.2)
nRBC: 0 % (ref 0.0–0.2)

## 2024-06-26 LAB — GLUCOSE, CAPILLARY
Glucose-Capillary: 100 mg/dL — ABNORMAL HIGH (ref 70–99)
Glucose-Capillary: 112 mg/dL — ABNORMAL HIGH (ref 70–99)

## 2024-06-26 LAB — DIC (DISSEMINATED INTRAVASCULAR COAGULATION)PANEL
D-Dimer, Quant: 6.21 ug{FEU}/mL — ABNORMAL HIGH (ref 0.00–0.50)
Fibrinogen: 615 mg/dL — ABNORMAL HIGH (ref 210–475)
INR: 1 (ref 0.8–1.2)
Platelets: 201 10*3/uL (ref 150–400)
Platelets: 197 10*3/uL (ref 150–400)
Prothrombin Time: 14.2 s (ref 11.4–15.2)
Smear Review: NONE SEEN
aPTT: 27 s (ref 24–36)

## 2024-06-26 LAB — PREPARE RBC (CROSSMATCH)

## 2024-06-26 LAB — ABO/RH: ABO/RH(D): O POS

## 2024-06-26 MED ORDER — DIBUCAINE (PERIANAL) 1 % EX OINT: 1.0000 | TOPICAL_OINTMENT | CUTANEOUS | Status: DC | PRN

## 2024-06-26 MED ORDER — IBUPROFEN 800 MG PO TABS
800.0000 mg | ORAL_TABLET | Freq: Three times a day (TID) | ORAL | Status: DC
  Administered 2024-06-26 – 2024-06-28 (×7): 800 mg via ORAL
  Filled 2024-06-26 (×7): qty 1

## 2024-06-26 MED ORDER — COCONUT OIL OIL: 1.0000 | TOPICAL_OIL | Status: DC | PRN

## 2024-06-26 MED ORDER — PRENATAL MULTIVITAMIN CH
1.0000 | ORAL_TABLET | Freq: Every day | ORAL | Status: DC
  Administered 2024-06-27 – 2024-06-28 (×2): 1 via ORAL
  Filled 2024-06-26 (×2): qty 1

## 2024-06-26 MED ORDER — CEFAZOLIN SODIUM-DEXTROSE 2-4 GM/100ML-% IV SOLN
2.0000 g | Freq: Once | INTRAVENOUS | Status: AC
  Administered 2024-06-26: 2 g via INTRAVENOUS
  Filled 2024-06-26: qty 100

## 2024-06-26 MED ORDER — TRANEXAMIC ACID-NACL 1000-0.7 MG/100ML-% IV SOLN
INTRAVENOUS | Status: AC
  Filled 2024-06-26: qty 100

## 2024-06-26 MED ORDER — TRANEXAMIC ACID-NACL 1000-0.7 MG/100ML-% IV SOLN: 1000.0000 mg | INTRAVENOUS | Status: AC

## 2024-06-26 MED ORDER — MISOPROSTOL 200 MCG PO TABS
1000.0000 ug | ORAL_TABLET | Freq: Once | ORAL | Status: AC
  Administered 2024-06-26: 1000 ug via RECTAL

## 2024-06-26 MED ORDER — BENZOCAINE-MENTHOL 20-0.5 % EX AERO: 1.0000 | INHALATION_SPRAY | CUTANEOUS | Status: DC | PRN

## 2024-06-26 MED ORDER — OXYCODONE HCL 5 MG PO TABS: 5.0000 mg | ORAL_TABLET | Freq: Four times a day (QID) | ORAL | Status: DC | PRN

## 2024-06-26 MED ORDER — SENNOSIDES-DOCUSATE SODIUM 8.6-50 MG PO TABS
2.0000 | ORAL_TABLET | ORAL | Status: DC
  Filled 2024-06-26 (×3): qty 2

## 2024-06-26 MED ORDER — SODIUM CHLORIDE 0.9% FLUSH
3.0000 mL | Freq: Two times a day (BID) | INTRAVENOUS | Status: DC
  Administered 2024-06-27: 3 mL via INTRAVENOUS

## 2024-06-26 MED ORDER — TETANUS-DIPHTH-ACELL PERTUSSIS 5-2-15.5 LF-MCG/0.5 IM SUSP: 0.5000 mL | Freq: Once | INTRAMUSCULAR | Status: DC

## 2024-06-26 MED ORDER — ACETAMINOPHEN 325 MG PO TABS
650.0000 mg | ORAL_TABLET | ORAL | Status: DC | PRN
  Administered 2024-06-28: 650 mg via ORAL
  Filled 2024-06-26: qty 2

## 2024-06-26 MED ORDER — SODIUM CHLORIDE 0.9% FLUSH
3.0000 mL | INTRAVENOUS | Status: DC | PRN
  Administered 2024-06-26: 3 mL via INTRAVENOUS

## 2024-06-26 MED ORDER — SODIUM CHLORIDE 0.9% IV SOLUTION: Freq: Once | INTRAVENOUS | Status: DC

## 2024-06-26 MED ORDER — NIFEDIPINE ER OSMOTIC RELEASE 30 MG PO TB24
30.0000 mg | ORAL_TABLET | Freq: Every day | ORAL | Status: DC
  Administered 2024-06-26 – 2024-06-28 (×3): 30 mg via ORAL
  Filled 2024-06-26 (×3): qty 1

## 2024-06-26 MED ORDER — DIPHENHYDRAMINE HCL 25 MG PO CAPS: 25.0000 mg | ORAL_CAPSULE | Freq: Four times a day (QID) | ORAL | Status: DC | PRN

## 2024-06-26 MED ORDER — POTASSIUM CHLORIDE CRYS ER 20 MEQ PO TBCR
20.0000 meq | EXTENDED_RELEASE_TABLET | Freq: Every day | ORAL | Status: DC
  Administered 2024-06-26 – 2024-06-28 (×3): 20 meq via ORAL
  Filled 2024-06-26 (×3): qty 1

## 2024-06-26 MED ORDER — METHYLERGONOVINE MALEATE 0.2 MG/ML IJ SOLN
INTRAMUSCULAR | Status: AC
  Filled 2024-06-26: qty 1

## 2024-06-26 MED ORDER — FUROSEMIDE 20 MG PO TABS
20.0000 mg | ORAL_TABLET | Freq: Every day | ORAL | Status: DC
  Administered 2024-06-26 – 2024-06-28 (×3): 20 mg via ORAL
  Filled 2024-06-26 (×3): qty 1

## 2024-06-26 MED ORDER — MISOPROSTOL 200 MCG PO TABS
ORAL_TABLET | ORAL | Status: AC
  Filled 2024-06-26: qty 1

## 2024-06-26 MED ORDER — SIMETHICONE 80 MG PO CHEW: 80.0000 mg | CHEWABLE_TABLET | ORAL | Status: DC | PRN

## 2024-06-26 MED ORDER — ONDANSETRON HCL 4 MG PO TABS: 4.0000 mg | ORAL_TABLET | ORAL | Status: DC | PRN

## 2024-06-26 MED ORDER — METHYLERGONOVINE MALEATE 0.2 MG/ML IJ SOLN
0.2000 mg | Freq: Once | INTRAMUSCULAR | Status: AC
  Administered 2024-06-26: 0.2 mg via INTRAMUSCULAR

## 2024-06-26 MED ORDER — MEASLES, MUMPS & RUBELLA VAC ~~LOC~~ SUSR: 0.5000 mL | Freq: Once | SUBCUTANEOUS | Status: DC

## 2024-06-26 MED ORDER — ONDANSETRON HCL 4 MG/2ML IJ SOLN: 4.0000 mg | INTRAMUSCULAR | Status: DC | PRN

## 2024-06-26 MED ORDER — WITCH HAZEL-GLYCERIN EX PADS: 1.0000 | MEDICATED_PAD | CUTANEOUS | Status: DC | PRN

## 2024-06-26 MED ORDER — METHYLERGONOVINE MALEATE 0.2 MG PO TABS: 0.2000 mg | ORAL_TABLET | Freq: Four times a day (QID) | ORAL | Status: DC

## 2024-06-26 MED ORDER — SODIUM CHLORIDE 0.9 % IV SOLN: 250.0000 mL | INTRAVENOUS | Status: DC | PRN

## 2024-06-26 NOTE — Progress Notes (Signed)
 Presented to patient room after delivery of infant (stable, with FOB in chair) in regards to aid with JADA placement. Manual sweep by myself yielded 100cc clot, approx 2L EBL prior to this, code hemorrhage ongoing. JADA obtained and placed without issue and cervical balloon inflated with 120cc NS, palpated in correct position. Suction placed to however now blood flow in tubing. This was replaced and slow blood return noted. Care passed back to primary team at this time. Plan already in place for IV abx BP 124/73   Pulse 97   Temp 98.6 F (37 C) (Oral)   Resp 18   Ht 5' 3 (1.6 m)   Wt (!) 156.4 kg   LMP 09/14/2023 (Exact Date)   SpO2 100%   BMI 61.10 kg/m

## 2024-06-26 NOTE — Progress Notes (Signed)
 LABOR PROGRESS NOTE Discussed with RN at 0015. S/p pit break, resumed at 2220, pit now at 8, IUPC being readjusted.   FHT: baseline 130, mod variability, +accels, -decels; overall category I Toco: once in  A/P:  Continue to titrate pitocin  until contractions are adequate and reassess cervical dilation  Angelica Maier, DO 12:18 AM

## 2024-06-26 NOTE — Progress Notes (Signed)
 Labor Progress Note Angelica Holmes is a 24 y.o. G1P0 at [redacted]w[redacted]d presented for IOL d/t gHTN.   S: Patient is resting on her left side. She is very emotional.   O:  BP 125/80   Pulse 97   Temp 98.6 F (37 C) (Oral)   Resp 18   Ht 5' 3 (1.6 m)   Wt (!) 156.4 kg   LMP 09/14/2023 (Exact Date)   SpO2 100%   BMI 61.10 kg/m   EFM: baseline 135 bpm/ moderate variability/ + accels/ - decels  Toco/IUPC: q1-3 mins  SVE: Dilation: Lip/rim Effacement (%): 100 Cervical Position: Middle Station: +2 Presentation: Vertex Exam by:: Mitzie Sar, RN Pitocin : 28 mu/min  A/P: 24 y.o. G1P0 [redacted]w[redacted]d  1. Labor: Progressing well. SVE: Lip/rim/100/+2. Lip/rim felt on maternal left. Patient repositioned to left side on peanut ball by RN. Continue on Pitocin . Pitocin  currently at 28. IUPC intact and working accordingly.   2. FWB: Cat 1 3. Pain: Epidural  4. gHTN  - BP's normotensive. Denies Pre-E symptoms.   Anticipate NSVD.  Digby Groeneveld, CNM 9:30 AM

## 2024-06-27 ENCOUNTER — Other Ambulatory Visit: Payer: Self-pay

## 2024-06-27 DIAGNOSIS — O24410 Gestational diabetes mellitus in pregnancy, diet controlled: Secondary | ICD-10-CM

## 2024-06-27 LAB — TYPE AND SCREEN
ABO/RH(D): O POS
Antibody Screen: NEGATIVE
Unit division: 0
Unit division: 0

## 2024-06-27 LAB — BPAM RBC
Blood Product Expiration Date: 202602232359
Blood Product Expiration Date: 202602232359
ISSUE DATE / TIME: 202602031216
ISSUE DATE / TIME: 202602031216
Unit Type and Rh: 5100
Unit Type and Rh: 5100

## 2024-06-27 NOTE — Progress Notes (Signed)
 POSTPARTUM PROGRESS NOTE  Post Partum Day #1  Subjective:  Angelica Holmes is a 24 y.o. G1P1001 s/p SVD at [redacted]w[redacted]d.  She reports she is doing well. No acute events overnight. She denies any problems with ambulating, voiding or po intake. Denies nausea or vomiting.  Pain is well controlled.  Lochia is minmal.  Objective: Blood pressure 108/67, pulse 79, temperature 98.1 F (36.7 C), temperature source Oral, resp. rate 16, height 5' 3 (1.6 m), weight (!) 156.4 kg, last menstrual period 09/14/2023, SpO2 100%, unknown if currently breastfeeding.  Physical Exam:  General: alert, cooperative and no distress Chest: no respiratory distress Heart:regular rate, distal pulses intact Abdomen: soft, nontender,  Uterine Fundus: firm, appropriately tender DVT Evaluation: No calf swelling or tenderness Extremities: Trace edema Skin: warm, dry  Recent Labs    06/26/24 1250 06/26/24 2119  HGB 10.2* 11.5*  HCT 30.6* 34.1*    Assessment/Plan: Angelica Holmes is a 24 y.o. G1P1001 s/p SVD at [redacted]w[redacted]d   PPD#1 - Doing well  Routine postpartum care S/P PPH: 2.5L EBL, acute blood loss with 2u pRBCs given. Hgb this AM 11.5. Will repeat CBC in the AM. Patient doing well, has been ambulating with no dizziness/near-syncope symptoms.  gHTN: BP's reviewed. Continue lasix /K/nifedipine .  A1GDM: 2 hour GTT postpartum.  Contraception: OP IUD Feeding: breast and bottle Dispo: Plan for discharge tomorrow.   LOS: 4 days   Barkley Angles, MD OB Fellow, Faculty Practice [facility], Center for [employer]

## 2024-06-27 NOTE — Lactation Note (Signed)
 This note was copied from a baby's chart. Lactation Consultation Note  Patient Name: Angelica Holmes Date: 06/27/2024 Age:24 years Reason for consult: Initial assessment;Primapara;Early term 37-38.6wks;Maternal endocrine disorder  P1. New mom has been giving DBM in NS to get baby to latch using curve tip syringe. LC taught hand expression and collected 1 ml in spoon. Used curve tip syringe inserted in NS. Mom needed #16 NS which she had a #20 & 16 that a RN gave her. #16 fitted best. Mom has edema to breast tissue. LC attempted to get baby to latch to mom's breast. Unable to unless [facility] held breast tissue w/t-cup hold into baby's mouth and she would nurse and stop.  Watched mom apply NS. Mom was un-able to apple NS by her self d/t breast anatomy and breast tissue soft. Mom unable to see nipple to apply NS and breast tissue very soft at this time making it very challenging to apply NS. Mom worried about smothering baby and kept pulling breast tissue back instead of firming breast tissue for latching. It was suggested to mom for now pump and bottle feed until breast tissue changes and edema is removed and breast tissue is firmer and easier to manage. Mom is fine with that. Mom is pumping every 3 hrs and giving baby DBM. Encouraged mom to continue to so so.   Feeding plan: Mom is to use DEBP every 3 hrs then hand express afterwards to collect more colostrum Give baby BM that mom collects first in bottle Then give DBM in bottle to equal amount the baby needs for hours of age. Call for OP LC appt.  LC gave mom #18 flanges to use on DEBP. Suggested mom order flange inserts for her personal DEBP and how to use them. Encouraged mom to call for assistance or questions as needed.   Maternal Data Has patient been taught Hand Expression?: Yes Does the patient have breastfeeding experience prior to this delivery?: No  Feeding Mother's Current Feeding Choice: Breast Milk and Donor  Milk  LATCH Score Latch: Grasps breast easily, tongue down, lips flanged, rhythmical sucking.  Audible Swallowing: None  Type of Nipple: Flat  Comfort (Breast/Nipple): Filling, red/small blisters or bruises, mild/mod discomfort (edema)  Hold (Positioning): Full assist, staff holds infant at breast  LATCH Score: 4   Lactation Tools Discussed/Used Tools: Pump;Flanges Flange Size: 18 Breast pump type: Double-Electric Breast Pump Pump Education: Setup, frequency, and cleaning;Milk Storage Reason for Pumping: supplementation/stimulation Pumping frequency: q 3hr  Interventions Interventions: Breast feeding basics reviewed;Assisted with latch;Skin to skin;Breast massage;Hand express;Breast compression;Adjust position;Support pillows;Position options;Expressed milk;DEBP;Education;LC Services brochure  Discharge Discharge Education: Outpatient recommendation Pump: DEBP (Spectra )  Consult Status Consult Status: Follow-up Date: 06/28/24 Follow-up type: In-patient    Latrece Nitta G 06/27/2024, 10:08 PM

## 2024-06-28 ENCOUNTER — Other Ambulatory Visit (HOSPITAL_COMMUNITY): Payer: Self-pay

## 2024-06-28 LAB — CBC
HCT: 26.6 % — ABNORMAL LOW (ref 36.0–46.0)
Hemoglobin: 8.9 g/dL — ABNORMAL LOW (ref 12.0–15.0)
MCH: 29.9 pg (ref 26.0–34.0)
MCHC: 33.5 g/dL (ref 30.0–36.0)
MCV: 89.3 fL (ref 80.0–100.0)
Platelets: 196 10*3/uL (ref 150–400)
RBC: 2.98 MIL/uL — ABNORMAL LOW (ref 3.87–5.11)
RDW: 15.3 % (ref 11.5–15.5)
WBC: 10.9 10*3/uL — ABNORMAL HIGH (ref 4.0–10.5)
nRBC: 0 % (ref 0.0–0.2)

## 2024-06-28 MED ORDER — FUROSEMIDE 20 MG PO TABS
20.0000 mg | ORAL_TABLET | Freq: Every day | ORAL | 0 refills | Status: AC
  Filled 2024-06-28: qty 2, 2d supply, fill #0

## 2024-06-28 MED ORDER — FERROUS FUMARATE 324 (106 FE) MG PO TABS
1.0000 | ORAL_TABLET | ORAL | Status: DC
  Administered 2024-06-28: 106 mg via ORAL
  Filled 2024-06-28: qty 1

## 2024-06-28 MED ORDER — FERROUS FUMARATE 324 (106 FE) MG PO TABS
1.0000 | ORAL_TABLET | ORAL | 2 refills | Status: DC
  Filled 2024-06-28: qty 30, 60d supply, fill #0

## 2024-06-28 MED ORDER — IBUPROFEN 800 MG PO TABS: 800.0000 mg | ORAL_TABLET | Freq: Three times a day (TID) | ORAL | 0 refills | Status: DC | PRN

## 2024-06-28 MED ORDER — FERROUS FUMARATE 324 (106 FE) MG PO TABS
1.0000 | ORAL_TABLET | ORAL | 2 refills | Status: AC
  Filled 2024-06-28: qty 30, 60d supply, fill #0

## 2024-06-28 MED ORDER — NIFEDIPINE ER 30 MG PO TB24: 30.0000 mg | ORAL_TABLET | Freq: Every day | ORAL | 3 refills | Status: DC

## 2024-06-28 MED ORDER — FUROSEMIDE 20 MG PO TABS: 20.0000 mg | ORAL_TABLET | Freq: Every day | ORAL | 0 refills | Status: DC

## 2024-06-28 MED ORDER — POTASSIUM CHLORIDE CRYS ER 20 MEQ PO TBCR
20.0000 meq | EXTENDED_RELEASE_TABLET | Freq: Every day | ORAL | 0 refills | Status: AC
  Filled 2024-06-28: qty 2, 2d supply, fill #0

## 2024-06-28 MED ORDER — IBUPROFEN 800 MG PO TABS
800.0000 mg | ORAL_TABLET | Freq: Three times a day (TID) | ORAL | 0 refills | Status: AC | PRN
  Filled 2024-06-28: qty 30, 10d supply, fill #0

## 2024-06-28 MED ORDER — POTASSIUM CHLORIDE CRYS ER 20 MEQ PO TBCR: 20.0000 meq | EXTENDED_RELEASE_TABLET | Freq: Every day | ORAL | 0 refills | Status: DC

## 2024-06-28 MED ORDER — FERROUS FUMARATE 324 (106 FE) MG PO TABS
1.0000 | ORAL_TABLET | ORAL | 2 refills | Status: DC
  Filled 2024-06-28: qty 15, 30d supply, fill #0

## 2024-06-28 MED ORDER — NIFEDIPINE ER 30 MG PO TB24
30.0000 mg | ORAL_TABLET | Freq: Every day | ORAL | 3 refills | Status: AC
  Filled 2024-06-28: qty 30, 30d supply, fill #0

## 2024-06-28 NOTE — Social Work (Signed)
 CSW received consult for hx of Anxiety, Depression, and PTSD.  CSW met with MOB to offer support and complete assessment.  CSW entered the room and observed MOB resting in bed holding the infant and guest at bedside, CSW introduced self and requested to speak to MOB alone. MOB's guests exited the room willingly. CSW inquired about how MOB was feeling, MOB reported good. CSW inquired about MOB MH hx, MOB reported a hx of anxiety depression and PTSD diagnosed in middle school. MOB denied current concerns with her MH and reported she had a stable mood throughout pregnancy. MOB reported she was taking medication for her Anxiety prior to pregnancy but stopped once finding out she was pregnant. CSW assessed for safety, MOB denied any SI or HI. CSW provided education regarding the baby blues period vs. perinatal mood disorders, discussed treatment and gave resources for mental health follow up if concerns arise.  CSW recommends self-evaluation during the postpartum time period using the New Mom Checklist from Postpartum Progress and encouraged MOB to contact a medical professional if symptoms are noted at any time. MOB identified FOB, her mom and sister as primary support.  CSW provided review of Sudden Infant Death Syndrome (SIDS) precautions. MOB reported she has all essential items for the infant including a bassinet and car seat.  CSW identifies no further need for intervention and no barriers to discharge at this time.  Roschelle Calandra, LCSWA Clinical Social Worker [phone]

## 2024-06-28 NOTE — Anesthesia Postprocedure Evaluation (Signed)
"   Anesthesia Post Note  Patient: Angelica Holmes  Procedure(s) Performed: AN AD HOC LABOR EPIDURAL     Patient location during evaluation: Mother Baby Anesthesia Type: Epidural Level of consciousness: awake and alert Pain management: pain level controlled Vital Signs Assessment: post-procedure vital signs reviewed and stable Respiratory status: spontaneous breathing, nonlabored ventilation and respiratory function stable Cardiovascular status: stable Postop Assessment: no headache, no backache and epidural receding Anesthetic complications: no   No notable events documented.  Last Vitals:  Vitals:   06/27/24 1940 06/28/24 0543  BP: 131/76 134/86  Pulse: 91 91  Resp: 17 18  Temp: 36.7 C   SpO2: 100% 100%    Last Pain:  Vitals:   06/28/24 0543  TempSrc:   PainSc: 1    Pain Goal:                   [name]      "

## 2024-06-28 NOTE — Lactation Note (Signed)
 This note was copied from a baby's chart. Lactation Consultation Note  Patient Name: Angelica Holmes Date: 06/28/2024 Age:22 hours Reason for consult: Follow-up assessment 1st baby, 1st time breast feeding, DL,  LC reviewed breast feeding D/C teaching and the [facility] resources,  Engorgement prevention and tx, importance of prevention.  Per mom has a DEBP at home. LC reviewed supply and demand, importance of consistent pumping around the clock and the breast tissue will change and making it easier to latch.  LC recommended and LC O/P and mom receptive but desires to call them to set up the apt.  Maternal Data Has patient been taught Hand Expression?: Yes Does the patient have breastfeeding experience prior to this delivery?: No  Feeding Mother's Current Feeding Choice: Breast Milk and Donor Milk Nipple Type: Slow - flow  LATCH Score - the LC from 11-7 am recommended for now to pump and bottle feed.     Lactation Tools Discussed/Used  DEBP   Interventions Interventions: Breast feeding basics reviewed;Hand pump;DEBP;Education;LC Services brochure;CDC milk storage guidelines;CDC Guidelines for Breast Pump Cleaning  Discharge Discharge Education: Engorgement and breast care;Warning signs for feeding baby;Outpatient recommendation (per mom is aware of Lactatiom resources and woild prefer to call herself for apt.) Pump: DEBP;Personal  Consult Status Consult Status: Complete Date: 06/28/24    Angelica Holmes 06/28/2024, 10:45 AM

## 2024-06-28 NOTE — Patient Instructions (Signed)
 If you are interested in an outpatient lactation consultation -- available in-office or virtually -- please reach out to us  at:  [facility] (First Floor) ?? [address] [place], [place]  ?? [phone] Please leave a message on our lactation voicemail box. We welcome any lactation-related questions or concerns -- our team is here to support you and your baby.  Lactation Support Groups Join us  at: [facility] ?? Tuesdays, 10:00 AM - 12:00 PM ?? [address], [place] [employer], [employer]  Lactating parents and lap babies are welcome!  ?? [email or web address]  ?? [email or web address] -------------  Si est interesado en una consulta ambulatoria de lactancia, disponible en el consultorio o virtualmente, comunquese con nosotros en:  [facility] ?? [address], [place], [place]  ?? [phone] Por favor, deje un mensaje en nuestro buzn de voz de lactancia. Estamos aqu para responder cualquier pregunta o inquietud relacionada con la lactancia y para apoyarle a usted y a su beb.  Grupos de Apoyo para la Lactancia nase a nosotros en: [facility] ?? [place], de 10:00 a. m. a 12:00 p. m. ?? [address], [place], [place]  Se admiten madres lactantes y bebs en regazo.  ?? [email or web address]  ?? [email or web address]      Angelica Holmes, [facility] for [facility]

## 2024-06-29 ENCOUNTER — Encounter (HOSPITAL_COMMUNITY): Payer: Self-pay | Admitting: Anesthesiology

## 2024-07-03 ENCOUNTER — Encounter: Admitting: Family Medicine

## 2024-07-03 ENCOUNTER — Encounter

## 2024-07-04 ENCOUNTER — Other Ambulatory Visit: Payer: Self-pay

## 2024-07-04 ENCOUNTER — Ambulatory Visit: Payer: Self-pay

## 2024-07-10 ENCOUNTER — Encounter: Admitting: Family Medicine

## 2024-07-17 ENCOUNTER — Encounter

## 2024-07-24 ENCOUNTER — Encounter

## 2024-07-31 ENCOUNTER — Encounter

## 2024-08-07 ENCOUNTER — Encounter

## 2024-08-08 ENCOUNTER — Ambulatory Visit: Admitting: Family Medicine

## 2024-08-14 ENCOUNTER — Encounter
